# Patient Record
Sex: Female | Born: 1960 | Race: Asian | Hispanic: No | Marital: Married | Smoking: Current every day smoker
Health system: Southern US, Community
[De-identification: ages and names within clinical notes are randomized; demographics above are authoritative.]

## PROBLEM LIST (undated history)

## (undated) DIAGNOSIS — T7840XA Allergy, unspecified, initial encounter: Secondary | ICD-10-CM

## (undated) DIAGNOSIS — J45909 Unspecified asthma, uncomplicated: Secondary | ICD-10-CM

## (undated) DIAGNOSIS — E119 Type 2 diabetes mellitus without complications: Secondary | ICD-10-CM

## (undated) DIAGNOSIS — J302 Other seasonal allergic rhinitis: Secondary | ICD-10-CM

## (undated) DIAGNOSIS — M1990 Unspecified osteoarthritis, unspecified site: Secondary | ICD-10-CM

## (undated) DIAGNOSIS — K219 Gastro-esophageal reflux disease without esophagitis: Secondary | ICD-10-CM

## (undated) HISTORY — PX: CHOLECYSTECTOMY: SHX55

## (undated) HISTORY — PX: UPPER GASTROINTESTINAL ENDOSCOPY: SHX188

## (undated) HISTORY — DX: Allergy, unspecified, initial encounter: T78.40XA

## (undated) HISTORY — DX: Gastro-esophageal reflux disease without esophagitis: K21.9

## (undated) HISTORY — DX: Unspecified osteoarthritis, unspecified site: M19.90

## (undated) HISTORY — PX: APPENDECTOMY: SHX54

## (undated) HISTORY — DX: Other seasonal allergic rhinitis: J30.2

## (undated) HISTORY — PX: TOTAL ABDOMINAL HYSTERECTOMY: SHX209

## (undated) HISTORY — PX: SPINE SURGERY: SHX786

## (undated) HISTORY — DX: Type 2 diabetes mellitus without complications: E11.9

---

## 2017-02-25 ENCOUNTER — Emergency Department (HOSPITAL_COMMUNITY)
Admission: EM | Admit: 2017-02-25 | Discharge: 2017-02-25 | Disposition: A | Discharge status: Home / Self Care | Payer: Self-pay | Attending: Emergency Medicine | Admitting: Emergency Medicine

## 2017-02-25 ENCOUNTER — Encounter (HOSPITAL_COMMUNITY): Payer: Self-pay

## 2017-02-25 DIAGNOSIS — M545 Low back pain, unspecified: Secondary | ICD-10-CM

## 2017-02-25 DIAGNOSIS — J45909 Unspecified asthma, uncomplicated: Secondary | ICD-10-CM | POA: Insufficient documentation

## 2017-02-25 DIAGNOSIS — F17210 Nicotine dependence, cigarettes, uncomplicated: Secondary | ICD-10-CM | POA: Insufficient documentation

## 2017-02-25 HISTORY — DX: Unspecified asthma, uncomplicated: J45.909

## 2017-02-25 MED ORDER — DIAZEPAM 5 MG PO TABS
5.0000 mg | ORAL_TABLET | Freq: Once | ORAL | Status: AC
  Administered 2017-02-25: 5 mg via ORAL
  Filled 2017-02-25: qty 1

## 2017-02-25 MED ORDER — DIAZEPAM 5 MG PO TABS: 5.0000 mg | ORAL_TABLET | Freq: Two times a day (BID) | ORAL | 0 refills | Status: DC

## 2017-02-25 MED ORDER — OXYCODONE-ACETAMINOPHEN 5-325 MG PO TABS
1.0000 | ORAL_TABLET | Freq: Once | ORAL | Status: AC
  Administered 2017-02-25: 1 via ORAL
  Filled 2017-02-25: qty 1

## 2017-02-25 MED ORDER — DICLOFENAC SODIUM 50 MG PO TBEC: 50.0000 mg | DELAYED_RELEASE_TABLET | Freq: Two times a day (BID) | ORAL | 0 refills | Status: DC

## 2017-02-25 NOTE — ED Provider Notes (Signed)
[facility]-EMERGENCY DEPT Provider Note   CSN: [id number] Arrival date & time: 02/25/17  1627     History   Chief Complaint Chief Complaint  Patient presents with  . Back Pain    HPI Lori Watkins is a 56 y.o. female who presents to the ED with low back pain that started one week ago and has gotten worse. Patient reports that 2 years she [place] [facility]. [place] since [place] but does not have an orthopedic doctor here yet. Patient has been taking Aleve with very little relief. She states that she is required to do a lot of heavy lifting at work and works 8 or 9 hours a day.   HPI  Past Medical History:  Diagnosis Date  . Asthma     There are no active problems to display for this patient.   History reviewed. No pertinent surgical history.  OB History    No data available       Home Medications    Prior to Admission medications   Medication Sig Start Date End Date Taking? Authorizing Provider  diazepam (VALIUM) 5 MG tablet Take 1 tablet (5 mg total) by mouth 2 (two) times daily. 02/25/17   Janne NapoleonNeese, Hope M, NP  diclofenac (VOLTAREN) 50 MG EC tablet Take 1 tablet (50 mg total) by mouth 2 (two) times daily. 02/25/17   Janne NapoleonNeese, Hope M, NP    Family History No family history on file.  Social History Social History  Substance Use Topics  . Smoking status: Current Every Day Smoker    Types: Cigarettes  . Smokeless tobacco: Never Used  . Alcohol use Not on file     Allergies   Morphine and related and Sulfa antibiotics   Review of Systems Review of Systems  Constitutional: Negative for chills and fever.  HENT: Negative.   Respiratory: Negative for chest tightness and shortness of breath.   Cardiovascular: Negative for chest pain.  Gastrointestinal: Positive for nausea (with the pain). Negative for abdominal pain and vomiting.  Genitourinary: Negative for dysuria, frequency and urgency.  Musculoskeletal: Positive for back pain. Negative for  neck pain.  Skin: Negative for wound.  Neurological: Negative for syncope and headaches (hx of migraines).  Psychiatric/Behavioral: Negative for confusion. The patient is not nervous/anxious.      Physical Exam Updated Vital Signs BP (!) 151/83 (BP Location: Left Arm)   Pulse 69   Temp 98.3 F (36.8 C) (Oral)   Resp 18   SpO2 100%   Physical Exam  Constitutional: She is oriented to person, place, and time. She appears well-developed and well-nourished. No distress.  HENT:  Head: Normocephalic and atraumatic.  Right Ear: Tympanic membrane normal.  Left Ear: Tympanic membrane normal.  Nose: Nose normal.  Mouth/Throat: Uvula is midline, oropharynx is clear and moist and mucous membranes are normal.  Eyes: EOM are normal.  Neck: Normal range of motion. Neck supple.  Cardiovascular: Normal rate and regular rhythm.   Pulmonary/Chest: Effort normal and breath sounds normal.  Abdominal: Soft. Bowel sounds are normal. There is no tenderness.  Musculoskeletal:       Lumbar back: She exhibits tenderness, pain and spasm. She exhibits normal pulse.  Neurological: She is alert and oriented to person, place, and time. She has normal strength. Gait normal.  Reflex Scores:      Bicep reflexes are 2+ on the right side and 2+ on the left side.      Brachioradialis reflexes are  2+ on the right side and 2+ on the left side.      Patellar reflexes are 2+ on the right side and 2+ on the left side. Skin: Skin is warm and dry.  Psychiatric: She has a normal mood and affect. Her behavior is normal.  Nursing note and vitals reviewed.    ED Treatments / Results  Labs (all labs ordered are listed, but only abnormal results are displayed) Labs Reviewed - No data to display  Radiology No results found.  Procedures Procedures (including critical care time)  Medications Ordered in ED Medications  diazepam (VALIUM) tablet 5 mg (5 mg Oral Given 02/25/17 1856)  oxyCODONE-acetaminophen  (PERCOCET/ROXICET) 5-325 MG per tablet 1 tablet (1 tablet Oral Given 02/25/17 1856)     Initial Impression / Assessment and Plan / ED Course  I have reviewed the triage vital signs and the nursing notes.  Final Clinical Impressions(s) / ED Diagnoses  Patient with back pain.  No neurological deficits and normal neuro exam.  Patient can walk but states is painful.  No loss of bowel or bladder control.  No concern for cauda equina.  No fever, night sweats, weight loss, h/o cancer, IVDU.  RICE protocol and pain medicine indicated and discussed with patient.  Rx muscle relaxant and NSAIDS. Ortho referral. Final diagnoses:  Lumbosacral pain    New Prescriptions Discharge Medication List as of 02/25/2017  7:31 PM    START taking these medications   Details  diazepam (VALIUM) 5 MG tablet Take 1 tablet (5 mg total) by mouth 2 (two) times daily., Starting Sat 02/25/2017, Print    diclofenac (VOLTAREN) 50 MG EC tablet Take 1 tablet (50 mg total) by mouth 2 (two) times daily., Starting Sat 02/25/2017, Print         ArtoisNeese, [place] M, [place] 02/26/17 [id number]    Arby BarrettePfeiffer, Marcy, MD 02/26/17 [phone]

## 2017-02-25 NOTE — ED Triage Notes (Signed)
Patient complains of chronic back pain that is worse the past week. Denies injury and states she has ortho doctor but has no pain medicine

## 2017-02-25 NOTE — Discharge Instructions (Signed)
Call Dr. Nilsa Nuttinglin's office or call you orthopedic doctor in [place] for follow up. Do not drive while taking the medication for your back spasm as it will make you sleepy.

## 2017-05-22 ENCOUNTER — Encounter (HOSPITAL_COMMUNITY): Payer: Self-pay | Admitting: Emergency Medicine

## 2017-05-22 ENCOUNTER — Emergency Department (HOSPITAL_COMMUNITY)
Admission: EM | Admit: 2017-05-22 | Discharge: 2017-05-22 | Disposition: A | Discharge status: Home / Self Care | Payer: Managed Care, Other (non HMO) | Attending: Emergency Medicine | Admitting: Emergency Medicine

## 2017-05-22 DIAGNOSIS — J45909 Unspecified asthma, uncomplicated: Secondary | ICD-10-CM | POA: Insufficient documentation

## 2017-05-22 DIAGNOSIS — R1030 Lower abdominal pain, unspecified: Secondary | ICD-10-CM | POA: Diagnosis not present

## 2017-05-22 DIAGNOSIS — Z79899 Other long term (current) drug therapy: Secondary | ICD-10-CM | POA: Insufficient documentation

## 2017-05-22 DIAGNOSIS — K922 Gastrointestinal hemorrhage, unspecified: Secondary | ICD-10-CM | POA: Diagnosis present

## 2017-05-22 DIAGNOSIS — F17210 Nicotine dependence, cigarettes, uncomplicated: Secondary | ICD-10-CM | POA: Diagnosis not present

## 2017-05-22 DIAGNOSIS — K625 Hemorrhage of anus and rectum: Secondary | ICD-10-CM | POA: Diagnosis not present

## 2017-05-22 LAB — CBC
HEMATOCRIT: 45.3 % (ref 36.0–46.0)
HEMOGLOBIN: 14.9 g/dL (ref 12.0–15.0)
MCH: 30.9 pg (ref 26.0–34.0)
MCHC: 32.9 g/dL (ref 30.0–36.0)
MCV: 94 fL (ref 78.0–100.0)
Platelets: 321 10*3/uL (ref 150–400)
RBC: 4.82 MIL/uL (ref 3.87–5.11)
RDW: 13.2 % (ref 11.5–15.5)
WBC: 8.8 10*3/uL (ref 4.0–10.5)

## 2017-05-22 LAB — COMPREHENSIVE METABOLIC PANEL
ALBUMIN: 4.1 g/dL (ref 3.5–5.0)
ALT: 24 U/L (ref 14–54)
ANION GAP: 11 (ref 5–15)
AST: 17 U/L (ref 15–41)
Alkaline Phosphatase: 85 U/L (ref 38–126)
BILIRUBIN TOTAL: 0.7 mg/dL (ref 0.3–1.2)
BUN: 12 mg/dL (ref 6–20)
CO2: 19 mmol/L — ABNORMAL LOW (ref 22–32)
Calcium: 9.5 mg/dL (ref 8.9–10.3)
Chloride: 108 mmol/L (ref 101–111)
Creatinine, Ser: 0.75 mg/dL (ref 0.44–1.00)
GFR calc non Af Amer: >60 mL/min (ref >60)
GLUCOSE: 119 mg/dL — AB (ref 65–99)
POTASSIUM: 4.1 mmol/L (ref 3.5–5.1)
SODIUM: 138 mmol/L (ref 135–145)
TOTAL PROTEIN: 7 g/dL (ref 6.5–8.1)

## 2017-05-22 LAB — TYPE AND SCREEN
ABO/RH(D): B POS
ANTIBODY SCREEN: NEGATIVE

## 2017-05-22 LAB — ABO/RH: ABO/RH(D): B POS

## 2017-05-22 MED ORDER — OXYCODONE-ACETAMINOPHEN 5-325 MG PO TABS: 1.0000 | ORAL_TABLET | ORAL | 0 refills | Status: DC | PRN

## 2017-05-22 NOTE — ED Provider Notes (Signed)
[facility] EMERGENCY DEPARTMENT Provider Note   CSN: [id number] Arrival date & time: 05/22/17  1156     History   Chief Complaint Chief Complaint  Patient presents with  . Abdominal Pain  . GI Bleeding    HPI Lori Watkins is a 56 y.o. female.  The history is provided by the patient.  Abdominal Pain   This is a new problem. The current episode started 2 days ago. The problem occurs constantly. The problem has not changed since onset.The pain is associated with an unknown factor. The pain is located in the LLQ, RLQ and suprapubic region. The pain is moderate. Associated symptoms include diarrhea, hematochezia, nausea and vomiting. Pertinent negatives include fever, melena, dysuria and frequency. Nothing aggravates the symptoms. Nothing relieves the symptoms.   56 year old female who presents with rectal bleeding and low abdominal pain. Reports history of migraine headaches and asthma. She is not anticoagulated but does take ibuprofen routinely for headaches. She reports onset of violent nausea, vomiting and diarrhea starting 3 days ago. Since 1 day ago, she noted 3 episodes of bloody diarrhea with some clots. Denies any hematemesis. Has been having low abdominal pain associated with this. Prior history of abdominal hysterectomy. No dysuria, urinary frequency, hematuria, fevers, or upper respiratory symptoms. No recent travel or antibiotics. No sick contacts.   Past Medical History:  Diagnosis Date  . Asthma     There are no active problems to display for this patient.   History reviewed. No pertinent surgical history.  OB History    No data available       Home Medications    Prior to Admission medications   Medication Sig Start Date End Date Taking? Authorizing Provider  naproxen sodium (ALEVE) 220 MG tablet Take 440 mg by mouth daily as needed (pain).   Yes [provider]  diazepam (VALIUM) 5 MG tablet Take 1 tablet (5 mg total) by  mouth 2 (two) times daily. Patient not taking: Reported on 05/22/2017 02/25/17   Janne Napoleon, NP  diclofenac (VOLTAREN) 50 MG EC tablet Take 1 tablet (50 mg total) by mouth 2 (two) times daily. Patient not taking: Reported on 05/22/2017 02/25/17   Janne Napoleon, NP  oxyCODONE-acetaminophen (PERCOCET/ROXICET) 5-325 MG tablet Take 1 tablet by mouth every 4 (four) hours as needed for severe pain. 05/22/17   Lavera Guise, MD    Family History No family history on file.  Social History Social History  Substance Use Topics  . Smoking status: Current Every Day Smoker    Types: Cigarettes  . Smokeless tobacco: Never Used  . Alcohol use Not on file     Allergies   Morphine and related and Sulfa antibiotics   Review of Systems Review of Systems  Constitutional: Negative for fever.  Gastrointestinal: Positive for abdominal pain, diarrhea, hematochezia, nausea and vomiting. Negative for melena.  Genitourinary: Negative for dysuria and frequency.  All other systems reviewed and are negative.    Physical Exam Updated Vital Signs BP (!) 107/58 (BP Location: Left Arm)   Pulse 65   Resp 18   SpO2 98%   Physical Exam Physical Exam  Nursing note and vitals reviewed. Constitutional: Well developed, well nourished, non-toxic, and in no acute distress Head: Normocephalic and atraumatic.  Mouth/Throat: Oropharynx is clear and moist.  Neck: Normal range of motion. Neck supple.  Cardiovascular: Normal rate and regular rhythm.   Pulmonary/Chest: Effort normal and breath sounds normal.  Abdominal: Soft. There is no  tenderness. There is no rebound and no guarding. Frank blood in rectum.  Musculoskeletal: Normal range of motion.  Neurological: Alert, no facial droop, fluent speech, moves all extremities symmetrically Skin: Skin is warm and dry.  Psychiatric: Cooperative   ED Treatments / Results  Labs (all labs ordered are listed, but only abnormal results are displayed) Labs Reviewed    COMPREHENSIVE METABOLIC PANEL - Abnormal; Notable for the following:       Result Value   CO2 19 (*)    Glucose, Bld 119 (*)    All other components within normal limits  GASTROINTESTINAL PANEL BY PCR, STOOL (REPLACES STOOL CULTURE)  CBC  POC OCCULT BLOOD, ED  TYPE AND SCREEN  ABO/RH    EKG  EKG Interpretation None       Radiology No results found.  Procedures Procedures (including critical care time)  Medications Ordered in ED Medications - No data to display   Initial Impression / Assessment and Plan / ED Course  I have reviewed the triage vital signs and the nursing notes.  Pertinent labs & imaging results that were available during my care of the patient were reviewed by me and considered in my medical decision making (see chart for details).     56 year old female who presents with bloody diarrhea. She is nontoxic in no acute distress. Hemodynamically she is stable. Abdomen is not peritoneal but she does have low abdominal tenderness. Frank blood noted on rectal exam. Blood work is overall reassuring. No significant leukocytosis or anemia. Discussed obtaining CT scan to rule out potential diverticulitis or colitis or other serious processes. She initially was very agreeable to this, but during ED course states that she could not wait much longer for CT scan. She is competent and have capacity to make own decisions. Acknowledges that her work-up is incomplete and expresses understanding that we have not ruled out infection or other potential serious causes. She requests that she follow-up closely with her primary care doctor. Given GI referral as well and encouraged to return to ED for any persistent or worsening symptoms. Strict return and follow-up instructions reviewed. She expressed understanding of all discharge instructions and felt comfortable with the plan of care.   Final Clinical Impressions(s) / ED Diagnoses   Final diagnoses:  Rectal bleeding    New  Prescriptions New Prescriptions   OXYCODONE-ACETAMINOPHEN (PERCOCET/ROXICET) 5-325 MG TABLET    Take 1 tablet by mouth every 4 (four) hours as needed for severe pain.     Lavera GuiseLiu, Dana Duo, MD 05/22/17 2051

## 2017-05-22 NOTE — ED Triage Notes (Signed)
Pt to ER for evaluation of GI bleeding-  onset Saturday with lower abd pain n/v/d. States Sunday began noticing bright red blood in stools, states emesis did not have any blood noted in it. Pt reports taking 2 aleve a day for "months" for her migraines. Pt is a/o x4. NAD. Denies taking any other medications.

## 2017-05-22 NOTE — Discharge Instructions (Signed)
We have not completed your work-up today. Although your blood work is reassuring, your bleeding could get worse and put you at risk for serious medical problems that could be life threatening.   Please return without fail for worsening symptoms, including fever, escalating pain, worsening bleeding, passing out, extreme fatigue or shortness of breath with activity, or any other symptoms concerning to you.  We have also provided follow-up with gastroenterologist

## 2017-05-22 NOTE — ED Notes (Signed)
Pt wants to leave. Pt states, "I have to be at work at 6 am in the morning and I have been here since noon. I can't wait here any longer." RN explained to pt that she needs an IV prior to having a CT scan done. Rn notified MD Verdie MosherLiu of situation, MD Liu to talk with pt.

## 2017-05-25 ENCOUNTER — Other Ambulatory Visit (INDEPENDENT_AMBULATORY_CARE_PROVIDER_SITE_OTHER): Payer: Managed Care, Other (non HMO)

## 2017-05-25 ENCOUNTER — Other Ambulatory Visit: Payer: Managed Care, Other (non HMO)

## 2017-05-25 ENCOUNTER — Encounter: Payer: Self-pay | Admitting: Gastroenterology

## 2017-05-25 ENCOUNTER — Ambulatory Visit (INDEPENDENT_AMBULATORY_CARE_PROVIDER_SITE_OTHER): Payer: Managed Care, Other (non HMO) | Admitting: Nurse Practitioner

## 2017-05-25 ENCOUNTER — Encounter: Payer: Self-pay | Admitting: Nurse Practitioner

## 2017-05-25 VITALS — BP 98/72 | HR 68 | Ht 62.0 in | Wt 148.4 lb

## 2017-05-25 DIAGNOSIS — K625 Hemorrhage of anus and rectum: Secondary | ICD-10-CM

## 2017-05-25 DIAGNOSIS — R1111 Vomiting without nausea: Secondary | ICD-10-CM | POA: Diagnosis not present

## 2017-05-25 DIAGNOSIS — K920 Hematemesis: Secondary | ICD-10-CM

## 2017-05-25 DIAGNOSIS — Z791 Long term (current) use of non-steroidal anti-inflammatories (NSAID): Secondary | ICD-10-CM

## 2017-05-25 LAB — BASIC METABOLIC PANEL
BUN: 21 mg/dL (ref 6–23)
CALCIUM: 9.5 mg/dL (ref 8.4–10.5)
CO2: 27 mEq/L (ref 19–32)
CREATININE: 0.85 mg/dL (ref 0.40–1.20)
Chloride: 103 mEq/L (ref 96–112)
GFR: 73.17 mL/min (ref >60.00)
Glucose, Bld: 113 mg/dL — ABNORMAL HIGH (ref 70–99)
Potassium: 4.2 mEq/L (ref 3.5–5.1)
SODIUM: 137 meq/L (ref 135–145)

## 2017-05-25 LAB — CBC
HCT: 42.4 % (ref 36.0–46.0)
HEMOGLOBIN: 14.1 g/dL (ref 12.0–15.0)
MCHC: 33.3 g/dL (ref 30.0–36.0)
MCV: 95.3 fl (ref 78.0–100.0)
Platelets: 306 10*3/uL (ref 150.0–400.0)
RBC: 4.45 Mil/uL (ref 3.87–5.11)
RDW: 12.8 % (ref 11.5–15.5)
WBC: 7.4 10*3/uL (ref 4.0–10.5)

## 2017-05-25 NOTE — Progress Notes (Deleted)
      HPI:      Past Medical History:  Diagnosis Date  . Asthma   . Diabetes (HCC)    borderline  . Seasonal allergies      Past Surgical History:  Procedure Laterality Date  . APPENDECTOMY    . SPINE SURGERY     tumor removed  . TOTAL ABDOMINAL HYSTERECTOMY     Family History  Problem Relation Age of Onset  . Family history unknown: Yes   Social History  Substance Use Topics  . Smoking status: Current Every Day Smoker    Types: Cigarettes  . Smokeless tobacco: Never Used  . Alcohol use Yes   Current Outpatient Prescriptions  Medication Sig Dispense Refill  . esomeprazole (NEXIUM) 20 MG capsule Take 20 mg by mouth 2 (two) times daily.    . naproxen sodium (ALEVE) 220 MG tablet Take 440 mg by mouth daily as needed (pain).    Marland Kitchen. oxyCODONE-acetaminophen (PERCOCET/ROXICET) 5-325 MG tablet Take 1 tablet by mouth every 4 (four) hours as needed for severe pain. 6 tablet 0   No current facility-administered medications for this visit.    Allergies  Allergen Reactions  . Morphine And Related Anaphylaxis  . Sulfa Antibiotics Rash     Review of Systems: All systems reviewed and negative except where noted in HPI.    Physical Exam: BP 98/72   Pulse 68   Ht 5\' 2"  (1.575 m)   Wt 148 lb 6 oz (67.3 kg)   BMI 27.14 kg/m  Constitutional:  Well-developed, ***female in no acute distress. Psychiatric: Normal mood and affect. Behavior is normal. EENT: Pupils normal.  Conjunctivae are normal. No scleral icterus. Neck supple.  Cardiovascular: Normal rate, regular rhythm. No edema Pulmonary/chest: Effort normal and breath sounds normal. No wheezing, rales or rhonchi. Abdominal: Soft, nondistended. Nontender. Bowel sounds active throughout. There are no masses palpable. No hepatomegaly. Lymphadenopathy: No cervical adenopathy noted. Neurological: Alert and oriented to person place and time. Skin: Skin is warm and dry. No rashes noted.   ASSESSMENT AND PLAN:   Willette ClusterPaula  Quantae Martel, NP  05/25/2017, 8:54 AM   Bubba HalesBloomfield, Robert, MD

## 2017-05-25 NOTE — Progress Notes (Signed)
HPI: Patient is a 56 yo female here for evaluation of hematemesis and rectal bleeding. Went to ED for vomiting , generalized abdominal cramping and rectal bleeding. which started on Saturday. Emesis was dark brown, coffee. Then started having rectal bleeding. Blood from rectum started out dark then got brighter in color. Some loose stools as well. She has been taking Aleve a few times a week for a few months. Today stools were normal, no blood but yesterday she was still seeing blood in stool. Her BUN was 12 in ED. WBC normal. Hgb normal at 14.9. She is very nauseated but no further vomiting since being in ED on Sunday. No fevers. She complains of upper abdominal burning.   Patient has a hx of GERD, on chronic BID Nexium. As long as she takes the Nexium she is asymptomatic from GERD standpoint.  Never had a colonoscopy.    Past Medical History:  Diagnosis Date  . Asthma   . Diabetes (HCC)    borderline  . Seasonal allergies      Past Surgical History:  Procedure Laterality Date  . APPENDECTOMY    . SPINE SURGERY     tumor removed  . TOTAL ABDOMINAL HYSTERECTOMY     Family History  Problem Relation Age of Onset  . Family history unknown: Yes   Social History  Substance Use Topics  . Smoking status: Current Every Day Smoker    Types: Cigarettes  . Smokeless tobacco: Never Used  . Alcohol use Yes   Current Outpatient Prescriptions  Medication Sig Dispense Refill  . esomeprazole (NEXIUM) 20 MG capsule Take 20 mg by mouth 2 (two) times daily.    . naproxen sodium (ALEVE) 220 MG tablet Take 440 mg by mouth daily as needed (pain).    Marland Kitchen oxyCODONE-acetaminophen (PERCOCET/ROXICET) 5-325 MG tablet Take 1 tablet by mouth every 4 (four) hours as needed for severe pain. 6 tablet 0   No current facility-administered medications for this visit.    Allergies  Allergen Reactions  . Morphine And Related Anaphylaxis  . Sulfa Antibiotics Rash     Review of Systems: All systems  reviewed and negative except where noted in HPI.    Physical Exam: BP 98/72   Pulse 68   Ht 5\' 2"  (1.575 m)   Wt 148 lb 6 oz (67.3 kg)   BMI 27.14 kg/m  Constitutional:  Well-developed, female in no acute distress. Psychiatric: Normal mood and affect. Behavior is normal. EENT: Pupils normal.  Conjunctivae are normal. No scleral icterus. Neck supple.  Cardiovascular: Normal rate, regular rhythm. No edema Pulmonary/chest: Effort normal and breath sounds normal. No wheezing, rales or rhonchi. Abdominal: Soft, nondistended. Nontender. Bowel sounds active throughout. There are no masses palpable. No hepatomegaly. Lymphadenopathy: No cervical adenopathy noted. Neurological: Alert and oriented to person place and time. Skin: Skin is warm and dry. No rashes noted.   ASSESSMENT AND PLAN:  56 yo female seen in ED a few day ago for hematemesis and rectal bleeding in setting of NSAIDs. She was hemodynamically stable. CBC normal. The rectal bleeding is a little puzzling as it was initially dark the turned brighter red.  -She needs EGD to start with. Will get that scheduled as soon as possible. The risks and benefits of EGD were discussed and the patient agrees to proceed.  -repeat CBC today.a  -No NSAIDS -Patient has never had a colonoscopy. She will need to be scheduled for one in the near future.   Gunnar Fusi  Wilmon PaliGuenther, NP  05/25/2017, 8:57 AM

## 2017-05-25 NOTE — Patient Instructions (Signed)
If you are age 57 or older, your body mass index should be between 23-30. Your Body mass index is 27.14 kg/m. If this is out of the aforementioned range listed, please consider follow up with your Primary Care Provider.  If you are age 57 or younger, your body mass index should be between 19-25. Your Body mass index is 27.14 kg/m. If this is out of the aformentioned range listed, please consider follow up with your Primary Care Provider.   You have been scheduled for an endoscopy. Please follow written instructions given to you at your visit today. If you use inhalers (even only as needed), please bring them with you on the day of your procedure. Your physician has requested that you go to www.startemmi.com and enter the access code given to you at your visit today. This web site gives a general overview about your procedure. However, you should still follow specific instructions given to you by our office regarding your preparation for the procedure.  Your physician has requested that you go to the basement for the following lab work before leaving today: BMET CBC  Thank you for choosing me and [facility].   Willette ClusterPaula Guenther, NP

## 2017-05-26 ENCOUNTER — Ambulatory Visit (AMBULATORY_SURGERY_CENTER): Payer: Managed Care, Other (non HMO) | Admitting: Gastroenterology

## 2017-05-26 ENCOUNTER — Encounter: Payer: Self-pay | Admitting: Gastroenterology

## 2017-05-26 VITALS — BP 90/60 | HR 58 | Temp 97.3°F | Resp 17 | Ht 62.0 in | Wt 148.0 lb

## 2017-05-26 DIAGNOSIS — K920 Hematemesis: Secondary | ICD-10-CM | POA: Diagnosis not present

## 2017-05-26 DIAGNOSIS — R1110 Vomiting, unspecified: Secondary | ICD-10-CM

## 2017-05-26 MED ORDER — SODIUM CHLORIDE 0.9 % IV SOLN: 500.0000 mL | INTRAVENOUS | Status: DC

## 2017-05-26 NOTE — Progress Notes (Signed)
Called to room to assist during endoscopic procedure.  Patient ID and intended procedure confirmed with present staff. Received instructions for my participation in the procedure from the performing physician.  

## 2017-05-26 NOTE — Progress Notes (Signed)
Report to PACU, RN, vss, BBS= Clear.  

## 2017-05-26 NOTE — Patient Instructions (Addendum)
YOU HAD AN ENDOSCOPIC PROCEDURE TODAY AT THE [facility]:   Refer to the procedure report that was given to you for any specific questions about what was found during the examination.  If the procedure report does not answer your questions, please call your gastroenterologist to clarify.  If you requested that your care partner not be given the details of your procedure findings, then the procedure report has been included in a sealed envelope for you to review at your convenience later.  YOU SHOULD EXPECT: Some feelings of bloating in the abdomen. Passage of more gas than usual.  Walking can help get rid of the air that was put into your GI tract during the procedure and reduce the bloating. If you had a lower endoscopy (such as a colonoscopy or flexible sigmoidoscopy) you may notice spotting of blood in your stool or on the toilet paper. If you underwent a bowel prep for your procedure, you may not have a normal bowel movement for a few days.  Please Note:  You might notice some irritation and congestion in your nose or some drainage.  This is from the oxygen used during your procedure.  There is no need for concern and it should clear up in a day or so.  SYMPTOMS TO REPORT IMMEDIATELY:   Following lower endoscopy (colonoscopy or flexible sigmoidoscopy):  Excessive amounts of blood in the stool  Significant tenderness or worsening of abdominal pains  Swelling of the abdomen that is new, acute  Fever of 100F or higher   Following upper endoscopy (EGD)  Vomiting of blood or coffee ground material  New chest pain or pain under the shoulder blades  Painful or persistently difficult swallowing  New shortness of breath  Fever of 100F or higher  Black, tarry-looking stools  For urgent or emergent issues, a gastroenterologist can be reached at any hour by calling [phone].   DIET:  We do recommend a small meal at first, but then you may proceed to your regular diet.  Drink  plenty of fluids but you should avoid alcoholic beverages for 24 hours.  ACTIVITY:  You should plan to take it easy for the rest of today and you should NOT DRIVE or use heavy machinery until tomorrow (because of the sedation medicines used during the test).    FOLLOW UP: Our staff will call the number listed on your records the next business day following your procedure to check on you and address any questions or concerns that you may have regarding the information given to you following your procedure. If we do not reach you, we will leave a message.  However, if you are feeling well and you are not experiencing any problems, there is no need to return our call.  We will assume that you have returned to your regular daily activities without incident.  If any biopsies were taken you will be contacted by phone or by letter within the next 1-3 weeks.  Please call us at [phone] if you have not heard about the biopsies in 3 weeks.    SIGNATURES/CONFIDENTIALITY: You and/or your care partner have signed paperwork which will be entered into your electronic medical record.  These signatures attest to the fact that that the information above on your After Visit Summary has been reviewed and is understood.  Full responsibility of the confidentiality of this discharge information lies with you and/or your care-partner.  Hiatal hernia information given. 

## 2017-05-26 NOTE — Op Note (Signed)
[facility] Patient Name: Lori DialsRadicka Armentor Procedure Date: 05/26/2017 3:50 PM MRN: [id number] Endoscopist: Napoleon FormKavitha V. Nandigam , MD Age: 56 Referring MD:  Date of Birth: [birth year] Gender: Female Account #: [id number] Procedure:                Upper GI endoscopy Indications:              Persistent vomiting of unknown cause Medicines:                Monitored Anesthesia Care Procedure:                Pre-Anesthesia Assessment:                           - Prior to the procedure, a History and Physical                            was performed, and patient medications and                            allergies were reviewed. The patient's tolerance of                            previous anesthesia was also reviewed. The risks                            and benefits of the procedure and the sedation                            options and risks were discussed with the patient.                            All questions were answered, and informed consent                            was obtained. Prior Anticoagulants: The patient has                            taken no previous anticoagulant or antiplatelet                            agents. ASA Grade Assessment: II - A patient with                            mild systemic disease. After reviewing the risks                            and benefits, the patient was deemed in                            satisfactory condition to undergo the procedure.                           After obtaining informed consent, the endoscope was  passed under direct vision. Throughout the                            procedure, the patient's blood pressure, pulse, and                            oxygen saturations were monitored continuously. The                            Endoscope was introduced through the mouth, and                            advanced to the second part of duodenum. The upper                            GI  endoscopy was accomplished without difficulty.                            The patient tolerated the procedure well. Scope In: Scope Out: Findings:                 The esophagus was normal.                           A small hiatal hernia was present.                           A few localized, less than 5 mm non-bleeding                            erosions were found in the gastric antrum. There                            were no stigmata of recent bleeding. Biopsies were                            taken with a cold forceps for Helicobacter pylori                            testing.                           The examined duodenum was normal. Complications:            No immediate complications. Estimated Blood Loss:     Estimated blood loss was minimal. Impression:               - Normal esophagus.                           - Small hiatal hernia.                           - Non-bleeding erosive gastropathy. Biopsied.                           - Normal examined  duodenum. Recommendation:           - Patient has a contact number available for                            emergencies. The signs and symptoms of potential                            delayed complications were discussed with the                            patient. Return to normal activities tomorrow.                            Written discharge instructions were provided to the                            patient.                           - Resume previous diet.                           - Continue present medications.                           - Await pathology results. Napoleon Form, MD 05/26/2017 4:09:47 PM This report has been signed electronically.

## 2017-05-28 ENCOUNTER — Encounter: Payer: Self-pay | Admitting: Nurse Practitioner

## 2017-05-29 ENCOUNTER — Telehealth: Payer: Self-pay

## 2017-05-29 NOTE — Telephone Encounter (Signed)
  Follow up Call-  Call Lori Watkins number 05/26/2017  Post procedure Call Lori Watkins phone  # [phone]  Permission to leave phone message Yes     Patient questions:  Do you have a fever, pain , or abdominal swelling? No. Pain Score  0 *  Have you tolerated food without any problems? Yes.    Have you been able to return to your normal activities? Yes.    Do you have any questions about your discharge instructions: Diet   No. Medications  No. Follow up visit  No.  Do you have questions or concerns about your Care? No.  Actions: * If pain score is 4 or above: No action needed, pain <4.

## 2017-06-02 ENCOUNTER — Encounter: Payer: Self-pay | Admitting: Gastroenterology

## 2017-06-02 NOTE — Progress Notes (Signed)
Reviewed and agree with documentation and assessment and plan. K. Veena Mong Neal , MD   

## 2017-08-28 ENCOUNTER — Other Ambulatory Visit: Payer: Self-pay

## 2017-08-28 ENCOUNTER — Encounter (HOSPITAL_COMMUNITY): Payer: Self-pay | Admitting: *Deleted

## 2017-08-28 ENCOUNTER — Emergency Department (HOSPITAL_COMMUNITY)
Admission: EM | Admit: 2017-08-28 | Discharge: 2017-08-28 | Disposition: A | Discharge status: Home / Self Care | Payer: Managed Care, Other (non HMO) | Attending: Emergency Medicine | Admitting: Emergency Medicine

## 2017-08-28 DIAGNOSIS — F17210 Nicotine dependence, cigarettes, uncomplicated: Secondary | ICD-10-CM | POA: Insufficient documentation

## 2017-08-28 DIAGNOSIS — J302 Other seasonal allergic rhinitis: Secondary | ICD-10-CM | POA: Insufficient documentation

## 2017-08-28 DIAGNOSIS — J45909 Unspecified asthma, uncomplicated: Secondary | ICD-10-CM | POA: Insufficient documentation

## 2017-08-28 DIAGNOSIS — Z79899 Other long term (current) drug therapy: Secondary | ICD-10-CM | POA: Insufficient documentation

## 2017-08-28 DIAGNOSIS — H00011 Hordeolum externum right upper eyelid: Secondary | ICD-10-CM | POA: Insufficient documentation

## 2017-08-28 DIAGNOSIS — H579 Unspecified disorder of eye and adnexa: Secondary | ICD-10-CM | POA: Diagnosis present

## 2017-08-28 MED ORDER — ERYTHROMYCIN 5 MG/GM OP OINT: TOPICAL_OINTMENT | OPHTHALMIC | 0 refills | Status: DC

## 2017-08-28 MED ORDER — FLUORESCEIN SODIUM 1 MG OP STRP: 1.0000 | ORAL_STRIP | Freq: Once | OPHTHALMIC | Status: DC

## 2017-08-28 MED ORDER — TETRACAINE HCL 0.5 % OP SOLN: 2.0000 [drp] | Freq: Once | OPHTHALMIC | Status: DC

## 2017-08-28 NOTE — ED Provider Notes (Signed)
[facility] Provider Note   CSN: [id number] Arrival date & time: 08/28/17  1319     History   Chief Complaint Chief Complaint  Patient presents with  . Eye Problem    HPI Lori Watkins is a 57 y.o. female.  HPI   Ms. Lori Watkins is a 57yo female with a history of asthma, allergies, pre-diabetes and GERD who presents to the emergency department for evaluation of right eyelid bump. Patient states that she noticed the red bump on the eyelid about 4 days ago.  It is tender to the touch.  Was concerned because she recently was exposed to someone who was diagnosed with pinkeye.  States that she also has had yellow drainage from the eyelid which appears crusty.  She endorses sensitivity to light. Has not tried any over-the-counter medications for her symptoms.  Denies visual disturbance, headache, painful EOM, itchy eye.  She does not use corrective lenses.  Past Medical History:  Diagnosis Date  . Allergy   . Arthritis   . Asthma   . Diabetes (HCC)    borderline  . GERD (gastroesophageal reflux disease)   . Seasonal allergies     There are no active problems to display for this patient.   Past Surgical History:  Procedure Laterality Date  . APPENDECTOMY    . CESAREAN SECTION    . CHOLECYSTECTOMY    . SPINE SURGERY     tumor removed  . TOTAL ABDOMINAL HYSTERECTOMY      OB History    No data available       Home Medications    Prior to Admission medications   Medication Sig Start Date End Date Taking? Authorizing Provider  esomeprazole (NEXIUM) 20 MG capsule Take 20 mg by mouth 2 (two) times daily.    [provider]  naproxen sodium (ALEVE) 220 MG tablet Take 440 mg by mouth daily as needed (pain).    [provider]  oxyCODONE-acetaminophen (PERCOCET/ROXICET) 5-325 MG tablet Take 1 tablet by mouth every 4 (four) hours as needed for severe pain. 05/22/17   Lavera GuiseLiu, Dana Duo, MD    Family History Family History   Family history unknown: Yes    Social History Social History   Tobacco Use  . Smoking status: Current Every Day Smoker    Packs/day: 0.25    Types: Cigarettes  . Smokeless tobacco: Never Used  Substance Use Topics  . Alcohol use: Yes    Comment: RARE  . Drug use: No     Allergies   Morphine and related and Sulfa antibiotics   Review of Systems Review of Systems  Eyes: Positive for photophobia, discharge and redness (eyelid redness). Negative for itching and visual disturbance.  Neurological: Negative for headaches.     Physical Exam Updated Vital Signs BP (!) 146/83 (BP Location: Left Arm)   Pulse 90   Temp 98.5 F (36.9 C) (Oral)   Resp 18   SpO2 100%   Physical Exam  Constitutional: She appears well-developed and well-nourished. No distress.  HENT:  Head: Normocephalic and atraumatic.  Eyes: Right eye exhibits no discharge. Left eye exhibits no discharge.    Approximately 0.5cm erythematous nodule noted on the upper right eyelid. It is tender to the touch. No drainage noted in the eye. No conjunctival erythema. EOM intact. PERRL.   Pulmonary/Chest: Effort normal. No respiratory distress.  Neurological: She is alert. Coordination normal.  Skin: She is not diaphoretic.  Psychiatric: She has a normal mood  and affect. Her behavior is normal.  Nursing note and vitals reviewed.   ED Treatments / Results  Labs (all labs ordered are listed, but only abnormal results are displayed) Labs Reviewed - No data to display  EKG  EKG Interpretation None       Radiology No results found.  Procedures Procedures (including critical care time)   Initial Impression / Assessment and Plan / ED Course  I have reviewed the triage vital signs and the nursing notes.  Pertinent labs & imaging results that were available during my care of the patient were reviewed by me and considered in my medical decision making (see chart for details).     Patient with  hordeolum. Have counseled patient on use of warm compresses for symptomatic relief. Presentation not concerning for conjunctivitis, iritis, corneal abrasion, orbital cellulitis. Discussed return precautions with patient and she agrees.    Final Clinical Impressions(s) / ED Diagnoses   Final diagnoses:  Hordeolum externum of right upper eyelid    ED Discharge Orders    None       Lawrence Marseilles 08/28/17 1953    Raeford Razor, MD 08/29/17 [phone]

## 2017-08-28 NOTE — ED Triage Notes (Signed)
Pt reports exposure to pink eye and now has redness to right eye, drainage and sensitivity to light. Also has right ear pain. No acute distress is noted at triage.

## 2017-08-28 NOTE — Discharge Instructions (Signed)
You have a stye on the right upper lid.  Please use warm compresses at least twice a day to help with inflammation and drainage.  You can also use antibiotic ointment over the right eye.   Follow up with your regular doctor in a week if your symptoms are not improving.   Return to the ER if you have fever >100.15F, loss of vision or have any other new or concerning symptoms.

## 2017-09-02 ENCOUNTER — Emergency Department (HOSPITAL_COMMUNITY)
Admission: EM | Admit: 2017-09-02 | Discharge: 2017-09-02 | Disposition: A | Discharge status: Home / Self Care | Payer: Managed Care, Other (non HMO) | Attending: Emergency Medicine | Admitting: Emergency Medicine

## 2017-09-02 ENCOUNTER — Encounter (HOSPITAL_COMMUNITY): Payer: Self-pay

## 2017-09-02 ENCOUNTER — Emergency Department (HOSPITAL_COMMUNITY): Payer: Managed Care, Other (non HMO)

## 2017-09-02 DIAGNOSIS — F17210 Nicotine dependence, cigarettes, uncomplicated: Secondary | ICD-10-CM | POA: Insufficient documentation

## 2017-09-02 DIAGNOSIS — J45909 Unspecified asthma, uncomplicated: Secondary | ICD-10-CM | POA: Diagnosis not present

## 2017-09-02 DIAGNOSIS — H00011 Hordeolum externum right upper eyelid: Secondary | ICD-10-CM | POA: Insufficient documentation

## 2017-09-02 DIAGNOSIS — Z79899 Other long term (current) drug therapy: Secondary | ICD-10-CM | POA: Insufficient documentation

## 2017-09-02 DIAGNOSIS — H05221 Edema of right orbit: Secondary | ICD-10-CM | POA: Diagnosis present

## 2017-09-02 LAB — CBC
HCT: 43.2 % (ref 36.0–46.0)
Hemoglobin: 14.4 g/dL (ref 12.0–15.0)
MCH: 31.6 pg (ref 26.0–34.0)
MCHC: 33.3 g/dL (ref 30.0–36.0)
MCV: 94.7 fL (ref 78.0–100.0)
PLATELETS: 303 10*3/uL (ref 150–400)
RBC: 4.56 MIL/uL (ref 3.87–5.11)
RDW: 13.3 % (ref 11.5–15.5)
WBC: 8.8 10*3/uL (ref 4.0–10.5)

## 2017-09-02 LAB — I-STAT CHEM 8, ED
BUN: 18 mg/dL (ref 6–20)
CALCIUM ION: 1.18 mmol/L (ref 1.15–1.40)
CHLORIDE: 107 mmol/L (ref 101–111)
Creatinine, Ser: 0.7 mg/dL (ref 0.44–1.00)
Glucose, Bld: 85 mg/dL (ref 65–99)
HCT: 45 % (ref 36.0–46.0)
Hemoglobin: 15.3 g/dL — ABNORMAL HIGH (ref 12.0–15.0)
POTASSIUM: 3.7 mmol/L (ref 3.5–5.1)
SODIUM: 142 mmol/L (ref 135–145)
TCO2: 26 mmol/L (ref 22–32)

## 2017-09-02 LAB — BASIC METABOLIC PANEL
ANION GAP: 12 (ref 5–15)
BUN: 17 mg/dL (ref 6–20)
CO2: 21 mmol/L — AB (ref 22–32)
Calcium: 9.3 mg/dL (ref 8.9–10.3)
Chloride: 108 mmol/L (ref 101–111)
Creatinine, Ser: 0.81 mg/dL (ref 0.44–1.00)
GFR calc Af Amer: >60 mL/min (ref >60)
Glucose, Bld: 89 mg/dL (ref 65–99)
POTASSIUM: 3.9 mmol/L (ref 3.5–5.1)
Sodium: 141 mmol/L (ref 135–145)

## 2017-09-02 MED ORDER — TRAMADOL HCL 50 MG PO TABS: 50.0000 mg | ORAL_TABLET | Freq: Two times a day (BID) | ORAL | 0 refills | Status: DC | PRN

## 2017-09-02 MED ORDER — KETOROLAC TROMETHAMINE 15 MG/ML IJ SOLN
15.0000 mg | Freq: Once | INTRAMUSCULAR | Status: AC
  Administered 2017-09-02: 15 mg via INTRAVENOUS
  Filled 2017-09-02: qty 1

## 2017-09-02 MED ORDER — DOXYCYCLINE HYCLATE 100 MG PO CAPS: 100.0000 mg | ORAL_CAPSULE | Freq: Two times a day (BID) | ORAL | 0 refills | Status: DC

## 2017-09-02 MED ORDER — IOPAMIDOL (ISOVUE-300) INJECTION 61%
INTRAVENOUS | Status: AC
  Administered 2017-09-02: 75 mL
  Filled 2017-09-02: qty 75

## 2017-09-02 NOTE — ED Provider Notes (Signed)
[facility] EMERGENCY DEPARTMENT Provider Note   CSN: [id number] Arrival date & time: 09/02/17  1435     History   Chief Complaint No chief complaint on file.   HPI Lori Watkins is a 57 y.o. female presenting for evaluation of right eyelid pain and swelling.  Pt states that she started to have pain and swelling 9 days ago.  She was evaluated in the ER 5 days ago, diagnosed with a stye.  She has been using erythromycin ointment and warm compresses without improvement of symptoms.  She uses Tylenol or ibuprofen as needed for pain, but this is not helping.  She reports worsening swelling surrounding the eye, worsening pain.  Pain is radiating to the ear and jaw.  She reports she is unable to to eat due to pain.  She describes vision blurriness, but denies vision change.  She does not wear contacts or glasses.  She denies fall, trauma, or injury.  She denies history of similar.  She reports subjective fevers.  She has no other medical problems, does not take medications daily.  She reports that initially she had yellow drainage when she woke up, but this has improved.  Patient denies sore throat, cough, chest pain, nausea, vomiting, abdominal pain, urinary symptoms, abnormal bowel movements.  HPI  Past Medical History:  Diagnosis Date  . Allergy   . Arthritis   . Asthma   . Diabetes (HCC)    borderline  . GERD (gastroesophageal reflux disease)   . Seasonal allergies     There are no active problems to display for this patient.   Past Surgical History:  Procedure Laterality Date  . APPENDECTOMY    . CESAREAN SECTION    . CHOLECYSTECTOMY    . SPINE SURGERY     tumor removed  . TOTAL ABDOMINAL HYSTERECTOMY      OB History    No data available       Home Medications    Prior to Admission medications   Medication Sig Start Date End Date Taking? Authorizing Provider  doxycycline (VIBRAMYCIN) 100 MG capsule Take 1 capsule (100 mg total) by mouth 2  (two) times daily. 09/02/17   Samanyu Tinnell, PA-C  erythromycin ophthalmic ointment Place a 1/2 inch ribbon of ointment into the lower eyelid. 08/28/17   Kellie Shropshire, PA-C  esomeprazole (NEXIUM) 20 MG capsule Take 20 mg by mouth 2 (two) times daily.    [provider]  naproxen sodium (ALEVE) 220 MG tablet Take 440 mg by mouth daily as needed (pain).    [provider]  oxyCODONE-acetaminophen (PERCOCET/ROXICET) 5-325 MG tablet Take 1 tablet by mouth every 4 (four) hours as needed for severe pain. 05/22/17   Lavera Guise, MD  traMADol (ULTRAM) 50 MG tablet Take 1 tablet (50 mg total) by mouth every 12 (twelve) hours as needed for severe pain. 09/02/17   Heyden Jaber, PA-C    Family History Family History  Family history unknown: Yes    Social History Social History   Tobacco Use  . Smoking status: Current Every Day Smoker    Packs/day: 0.25    Types: Cigarettes  . Smokeless tobacco: Never Used  Substance Use Topics  . Alcohol use: Yes    Comment: RARE  . Drug use: No     Allergies   Morphine and related; Vicodin [hydrocodone-acetaminophen]; and Sulfa antibiotics   Review of Systems Review of Systems  Constitutional: Positive for fever (subjective).  HENT: Positive for ear  pain and facial swelling. Negative for sore throat.   Eyes: Positive for pain. Negative for photophobia and discharge.  Respiratory: Negative for cough and shortness of breath.   Cardiovascular: Negative for chest pain.  Gastrointestinal: Negative for abdominal pain, nausea and vomiting.  Genitourinary: Negative for dysuria, frequency and hematuria.  Musculoskeletal: Negative for neck pain.  Skin: Negative for rash.  Allergic/Immunologic: Negative for immunocompromised state.  Neurological: Negative for dizziness and headaches.  Hematological: Does not bruise/bleed easily.  Psychiatric/Behavioral: Negative for confusion.     Physical Exam Updated Vital Signs BP 135/81  (BP Location: Right Arm)   Pulse 70   Temp 98 F (36.7 C) (Oral)   Resp 16   SpO2 98%   Physical Exam  Constitutional: She is oriented to person, place, and time. She appears well-developed and well-nourished. No distress.  HENT:  Head: Normocephalic and atraumatic.  Right Ear: Tympanic membrane, external ear and ear canal normal.  Left Ear: Tympanic membrane, external ear and ear canal normal.  Nose: Nose normal.  Mouth/Throat: Uvula is midline, oropharynx is clear and moist and mucous membranes are normal.  Tenderness palpation of the right side of the face.  Uvula midline with equal palate rise.  OP clear without tonsillar swelling or exudate.  TMs nonerythematous and not bulging bilaterally.  Right-sided postauricular and submandibular lymphadenopathy.  Eyes: Conjunctivae and EOM are normal. Pupils are equal, round, and reactive to light.    Lesion noted in the right eyelid.  Mild periorbital swelling.  No obvious facial swelling.  EOMI without pain.  PERRLA.  No injection of the conjunctive sclera.  No signs of iritis.  No photophobia.  Neck: Normal range of motion.  Cardiovascular: Normal rate, regular rhythm and intact distal pulses.  Pulmonary/Chest: Effort normal and breath sounds normal. No respiratory distress. She has no wheezes.  Abdominal: She exhibits no distension.  Musculoskeletal: Normal range of motion.  Neurological: She is alert and oriented to person, place, and time.  Skin: Skin is warm. No rash noted.  Psychiatric: She has a normal mood and affect.  Nursing note and vitals reviewed.    ED Treatments / Results  Labs (all labs ordered are listed, but only abnormal results are displayed) Labs Reviewed  BASIC METABOLIC PANEL - Abnormal; Notable for the following components:      Result Value   CO2 21 (*)    All other components within normal limits  I-STAT CHEM 8, ED - Abnormal; Notable for the following components:   Hemoglobin 15.3 (*)    All other  components within normal limits  CBC    EKG  EKG Interpretation None       Radiology Ct Orbits W Contrast  Result Date: 09/02/2017 CLINICAL DATA:  Pain at the right eyelid. Stye 5 days ago. Progressive inflammation despite treatment. Inflammation of the eyelid. EXAM: CT ORBITS WITH CONTRAST TECHNIQUE: Multidetector CT images was performed according to the standard protocol following intravenous contrast administration. CONTRAST:  75mL ISOVUE-300 IOPAMIDOL (ISOVUE-300) INJECTION 61% COMPARISON:  None. FINDINGS: Orbits: A low-density peripherally enhancing 8 mm lesion within the right eyelid is compatible with persistent obstruction of well ducts within the eyelid. There is no other significant inflammatory change. No postseptal inflammatory changes are present. The lacrimal gland is separate. The globe is within normal limits. Rectus musculature is unremarkable. The optic nerve is normal bilaterally. Cavernous sinus is normal. Visualized sinuses: A fluid level is present in the left sphenoid sinus. 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 The remaining paranasal sinuses are clear. Soft tissues: The soft tissues of the face are otherwise unremarkable. Limited intracranial: Limited imaging the brain is within normal limits. IMPRESSION: 1. Hordeolum of the right eyelid without other significant inflammatory changes. No postseptal inflammatory change. 2. Otherwise unremarkable orbit CT. Electronically Signed   By: Marin Roberts M.D.   On: 09/02/2017 19:22    Procedures Procedures (including critical care time)  Medications Ordered in ED Medications  iopamidol (ISOVUE-300) 61 % injection (75 mLs  Contrast Given 09/02/17 1839)  ketorolac (TORADOL) 15 MG/ML injection 15 mg (15 mg Intravenous Given 09/02/17 1918)     Initial Impression / Assessment and Plan / ED Course  I have reviewed the triage vital signs and the nursing notes.  Pertinent labs & imaging  results that were available during my care of the patient were reviewed by me and considered in my medical decision making (see chart for details).     Patient presenting for evaluation of eyelid pain and swelling.  Physical exam shows tender, erythematous lesion of the right upper eyelid with mild periorbital swelling.  EOMI and PERRLA.  As this is been going on for 9 days, and worsening with erythromycin and warm compresses, will obtain CT scan to rule out abscess or mass of eyelid.  Doubt post septal cellulitis.  Will obtain basic blood work.    Labs reassuring. CT shows hordeolum without signs of post septal cellulitis. Discussed findings with pt. Will place on doxy and have pt f/u with ophthalmology for further management. Tylenol and tramadol for pain (pt states she cannot take ibuprofen). Checked PMP, pt with minimal narcotic use. At this time, pt appears safe for discharge. Return precautions given. Pt states she understands and agrees to plan.   Final Clinical Impressions(s) / ED Diagnoses   Final diagnoses:  Hordeolum of right upper eyelid, unspecified hordeolum type    ED Discharge Orders        Ordered    doxycycline (VIBRAMYCIN) 100 MG capsule  2 times daily     09/02/17 1936    traMADol (ULTRAM) 50 MG tablet  Every 12 hours PRN     09/02/17 1944       Alveria Apley, PA-C 09/02/17 1951    Tilden Fossa, MD 09/03/17 1453

## 2017-09-02 NOTE — Discharge Instructions (Signed)
Take antibiotics as prescribed.  Take the entire course, even if your symptoms improve. Take 1000 mg of Tylenol 3 times a day.  Use Ultram as needed for severe pain.  Do not drive while taking this medicine. Continue to use warm compresses. Follow-up with the ophthalmologist on Monday for further evaluation and management of your eye. Return to the emergency room if you develop vision loss, inability to move your eye, or any new or worsening symptoms.

## 2017-09-02 NOTE — ED Triage Notes (Addendum)
Pt presents with continued pain to R eyelid, diagnosed with sty x 5-6 days ago,  Pt reports she is using the medication but swelling is worse, pain to R ear is worse and now she is having R sided jaw pain. Pt reports swelling to R side of face with redness.

## 2017-09-02 NOTE — ED Notes (Signed)
Pt understood dc material. NAD noted. Script given at dc  

## 2017-11-24 ENCOUNTER — Emergency Department (HOSPITAL_COMMUNITY): Payer: Managed Care, Other (non HMO)

## 2017-11-24 ENCOUNTER — Emergency Department (HOSPITAL_COMMUNITY)
Admission: EM | Admit: 2017-11-24 | Discharge: 2017-11-24 | Disposition: A | Discharge status: Home / Self Care | Payer: Managed Care, Other (non HMO) | Attending: Emergency Medicine | Admitting: Emergency Medicine

## 2017-11-24 ENCOUNTER — Encounter (HOSPITAL_COMMUNITY): Payer: Self-pay | Admitting: Emergency Medicine

## 2017-11-24 DIAGNOSIS — J45909 Unspecified asthma, uncomplicated: Secondary | ICD-10-CM | POA: Insufficient documentation

## 2017-11-24 DIAGNOSIS — E119 Type 2 diabetes mellitus without complications: Secondary | ICD-10-CM | POA: Insufficient documentation

## 2017-11-24 DIAGNOSIS — K1121 Acute sialoadenitis: Secondary | ICD-10-CM | POA: Diagnosis not present

## 2017-11-24 DIAGNOSIS — R590 Localized enlarged lymph nodes: Secondary | ICD-10-CM | POA: Insufficient documentation

## 2017-11-24 DIAGNOSIS — F17210 Nicotine dependence, cigarettes, uncomplicated: Secondary | ICD-10-CM | POA: Insufficient documentation

## 2017-11-24 DIAGNOSIS — R221 Localized swelling, mass and lump, neck: Secondary | ICD-10-CM | POA: Diagnosis present

## 2017-11-24 LAB — I-STAT CHEM 8, ED
BUN: 14 mg/dL (ref 6–20)
CALCIUM ION: 1.17 mmol/L (ref 1.15–1.40)
CHLORIDE: 106 mmol/L (ref 101–111)
CREATININE: 0.8 mg/dL (ref 0.44–1.00)
GLUCOSE: 95 mg/dL (ref 65–99)
HEMATOCRIT: 43 % (ref 36.0–46.0)
Hemoglobin: 14.6 g/dL (ref 12.0–15.0)
Potassium: 3.7 mmol/L (ref 3.5–5.1)
SODIUM: 139 mmol/L (ref 135–145)
TCO2: 21 mmol/L — AB (ref 22–32)

## 2017-11-24 LAB — CBC WITH DIFFERENTIAL/PLATELET
Basophils Absolute: 0 10*3/uL (ref 0.0–0.1)
Basophils Relative: 0 %
EOS ABS: 0.1 10*3/uL (ref 0.0–0.7)
Eosinophils Relative: 2 %
HEMATOCRIT: 42.7 % (ref 36.0–46.0)
HEMOGLOBIN: 14.3 g/dL (ref 12.0–15.0)
LYMPHS ABS: 3.1 10*3/uL (ref 0.7–4.0)
Lymphocytes Relative: 34 %
MCH: 31.2 pg (ref 26.0–34.0)
MCHC: 33.5 g/dL (ref 30.0–36.0)
MCV: 93 fL (ref 78.0–100.0)
MONOS PCT: 6 %
Monocytes Absolute: 0.6 10*3/uL (ref 0.1–1.0)
NEUTROS ABS: 5.3 10*3/uL (ref 1.7–7.7)
NEUTROS PCT: 58 %
Platelets: 325 10*3/uL (ref 150–400)
RBC: 4.59 MIL/uL (ref 3.87–5.11)
RDW: 13.2 % (ref 11.5–15.5)
WBC: 9.1 10*3/uL (ref 4.0–10.5)

## 2017-11-24 LAB — GROUP A STREP BY PCR: GROUP A STREP BY PCR: NOT DETECTED

## 2017-11-24 MED ORDER — MELOXICAM 15 MG PO TABS: 15.0000 mg | ORAL_TABLET | Freq: Every day | ORAL | 0 refills | Status: DC

## 2017-11-24 MED ORDER — IOHEXOL 300 MG/ML  SOLN
75.0000 mL | Freq: Once | INTRAMUSCULAR | Status: AC | PRN
  Administered 2017-11-24: 75 mL via INTRAVENOUS

## 2017-11-24 MED ORDER — AMOXICILLIN-POT CLAVULANATE 875-125 MG PO TABS: 1.0000 | ORAL_TABLET | Freq: Two times a day (BID) | ORAL | 0 refills | Status: DC

## 2017-11-24 NOTE — ED Provider Notes (Signed)
5:09 PM BP 125/73 (BP Location: Right Arm)   Pulse 72   Temp 98 F (36.7 C) (Oral)   Resp 18   SpO2 100%  Patient taken in sign out form PA Laveda Norman. Patient here with swollen lymph nodes x 1 month. Awaiting Ct neck.   7:25 PM BP 125/73 (BP Location: Right Arm)   Pulse 72   Temp 98 F (36.7 C) (Oral)   Resp 18   SpO2 100%   I have discussed the case with Dr. Juleen China.  CT scan shows parotitis.  Given the fact that she is afebrile, it has been going on for 1 month and she has no voice changes or airway occlusion we will treat with oral antibiotics.  Patient is advised to make an appointment with ENT for follow-up.  She may cancel if her symptoms are improving.  I discussed return precautions with the patient.  She appears appropriate for discharge at this time   Arthor Captain, Cordelia Poche 11/24/17 1945    Raeford Razor, MD 11/28/17 1043

## 2017-11-24 NOTE — Discharge Instructions (Signed)
Contact a health care provider if: You have a fever or chills. You have new symptoms. Your symptoms get worse. Your symptoms do not improve with treatment.

## 2017-11-24 NOTE — ED Notes (Signed)
Returned from ct 

## 2017-11-24 NOTE — ED Notes (Signed)
To ct

## 2017-11-24 NOTE — ED Triage Notes (Signed)
Pt states 1.5 months ago she noticed she had swelling to left neck. Took Meloxicam for 30 days, states the lymph nodes to her neck are still swollen and she is out of the mediation. Pain with yawning, coughing, eating to left ear and left neck.

## 2017-11-24 NOTE — ED Provider Notes (Signed)
[facility] EMERGENCY DEPARTMENT Provider Note   CSN: [id number] Arrival date & time: 11/24/17  1559     History   Chief Complaint Chief Complaint  Patient presents with  . Swollen lymph nodes    neck    HPI Lori Watkins is a 57 y.o. female.  HPI   57 year old female with history of diabetes, GERD, allergy, presenting with a swollen lymph node in the neck.  Patient mention for about a month and a half she has had swollen lymph node to her neck.  It started to the left side of her neck and now has bright to both side of the neck.  Pain is sharp throbbing achy constant worsening with talking movement swallowing.  She is having trouble eating due to the pain and states she lost approximately 6 pounds within the past week due to decreased eating.  She also endorsed night sweats.  She is a smoker and smokes approximately 5 cigarettes a day.  No prior history of cancer.  No hearing changes or voice changes.  She was initially seen by her PCP for this.  At the onset of her symptoms.  She was given meloxicam for a month.  She took the medication which did provide relief but now that she is without her medication, her pain has becoming more uncomfortable.  Past Medical History:  Diagnosis Date  . Allergy   . Arthritis   . Asthma   . Diabetes (HCC)    borderline  . GERD (gastroesophageal reflux disease)   . Seasonal allergies     There are no active problems to display for this patient.   Past Surgical History:  Procedure Laterality Date  . APPENDECTOMY    . CESAREAN SECTION    . CHOLECYSTECTOMY    . SPINE SURGERY     tumor removed  . TOTAL ABDOMINAL HYSTERECTOMY       OB History   None      Home Medications    Prior to Admission medications   Medication Sig Start Date End Date Taking? Authorizing Provider  doxycycline (VIBRAMYCIN) 100 MG capsule Take 1 capsule (100 mg total) by mouth 2 (two) times daily. 09/02/17   Caccavale, Sophia, PA-C    erythromycin ophthalmic ointment Place a 1/2 inch ribbon of ointment into the lower eyelid. 08/28/17   Kellie Shropshire, PA-C  esomeprazole (NEXIUM) 20 MG capsule Take 20 mg by mouth 2 (two) times daily.    [provider]  naproxen sodium (ALEVE) 220 MG tablet Take 440 mg by mouth daily as needed (pain).    [provider]  oxyCODONE-acetaminophen (PERCOCET/ROXICET) 5-325 MG tablet Take 1 tablet by mouth every 4 (four) hours as needed for severe pain. 05/22/17   Lavera Guise, MD  traMADol (ULTRAM) 50 MG tablet Take 1 tablet (50 mg total) by mouth every 12 (twelve) hours as needed for severe pain. 09/02/17   Caccavale, Sophia, PA-C    Family History Family History  Family history unknown: Yes    Social History Social History   Tobacco Use  . Smoking status: Current Every Day Smoker    Packs/day: 0.25    Types: Cigarettes  . Smokeless tobacco: Never Used  Substance Use Topics  . Alcohol use: Yes    Comment: RARE  . Drug use: No     Allergies   Morphine and related; Vicodin [hydrocodone-acetaminophen]; and Sulfa antibiotics   Review of Systems Review of Systems  All other systems reviewed and  are negative.    Physical Exam Updated Vital Signs BP 125/73 (BP Location: Right Arm)   Pulse 72   Temp 98 F (36.7 C) (Oral)   Resp 18   SpO2 100%   Physical Exam  Constitutional: She appears well-developed and well-nourished. No distress.  HENT:  Head: Atraumatic.  Right Ear: External ear normal.  Left Ear: External ear normal.  Nose: Nose normal.  Mouth/Throat: Oropharynx is clear and moist. No oropharyngeal exudate.  Eyes: Conjunctivae are normal.  Neck: Normal range of motion. Neck supple. No tracheal deviation present. No thyromegaly present.  No nuchal rigidity  Cardiovascular: Normal rate and regular rhythm.  Pulmonary/Chest: Effort normal and breath sounds normal.  Abdominal: Soft. There is no tenderness.  Lymphadenopathy:    She has cervical  adenopathy.  Neurological: She is alert.  Skin: No rash noted.  Psychiatric: She has a normal mood and affect.  Nursing note and vitals reviewed.    ED Treatments / Results  Labs (all labs ordered are listed, but only abnormal results are displayed) Labs Reviewed  I-STAT CHEM 8, ED - Abnormal; Notable for the following components:      Result Value   TCO2 21 (*)    All other components within normal limits  RAPID STREP SCREEN ([facility] & [facility] ONLY)  CBC WITH DIFFERENTIAL/PLATELET    EKG None  Radiology No results found.  Procedures Procedures (including critical care time)  Medications Ordered in ED Medications - No data to display   Initial Impression / Assessment and Plan / ED Course  I have reviewed the triage vital signs and the nursing notes.  Pertinent labs & imaging results that were available during my care of the patient were reviewed by me and considered in my medical decision making (see chart for details).     BP 125/73 (BP Location: Right Arm)   Pulse 72   Temp 98 F (36.7 C) (Oral)   Resp 18   SpO2 100%    Final Clinical Impressions(s) / ED Diagnoses   Final diagnoses:  None    ED Discharge Orders    None     4:34 PM Patient here with painful lymph nodes on her neck suggestive of lymphadenitis.  Her condition has been ongoing for nearly 2 months without resolution.  Throat exam unremarkable, no fever however patient does have history of tobacco abuse, endorsed weight loss as well as night sweats therefore she would benefit from a neck CT with contrast to rule out malignancy.  Will obtain rapid strep test to assess for potential underlying infection although my suspicion is low for that.  5:08 PM Pt sign out to Arthor Captain, PA-C who will f/u on CT scan and decide disposition.    Fayrene Helper, PA-C 11/24/17 1712    Raeford Razor, MD 11/24/17 [phone]

## 2018-01-31 ENCOUNTER — Other Ambulatory Visit: Payer: Self-pay

## 2018-01-31 ENCOUNTER — Emergency Department (HOSPITAL_COMMUNITY)
Admission: EM | Admit: 2018-01-31 | Discharge: 2018-01-31 | Disposition: A | Discharge status: Home / Self Care | Payer: Managed Care, Other (non HMO) | Attending: Emergency Medicine | Admitting: Emergency Medicine

## 2018-01-31 ENCOUNTER — Encounter (HOSPITAL_COMMUNITY): Payer: Self-pay

## 2018-01-31 ENCOUNTER — Emergency Department (HOSPITAL_COMMUNITY): Payer: Managed Care, Other (non HMO)

## 2018-01-31 DIAGNOSIS — F17210 Nicotine dependence, cigarettes, uncomplicated: Secondary | ICD-10-CM | POA: Diagnosis not present

## 2018-01-31 DIAGNOSIS — Z79899 Other long term (current) drug therapy: Secondary | ICD-10-CM | POA: Insufficient documentation

## 2018-01-31 DIAGNOSIS — B9689 Other specified bacterial agents as the cause of diseases classified elsewhere: Secondary | ICD-10-CM

## 2018-01-31 DIAGNOSIS — J019 Acute sinusitis, unspecified: Secondary | ICD-10-CM | POA: Diagnosis not present

## 2018-01-31 DIAGNOSIS — J45909 Unspecified asthma, uncomplicated: Secondary | ICD-10-CM | POA: Insufficient documentation

## 2018-01-31 DIAGNOSIS — R05 Cough: Secondary | ICD-10-CM | POA: Diagnosis present

## 2018-01-31 LAB — URINALYSIS, ROUTINE W REFLEX MICROSCOPIC
BILIRUBIN URINE: NEGATIVE
GLUCOSE, UA: NEGATIVE mg/dL
HGB URINE DIPSTICK: NEGATIVE
KETONES UR: NEGATIVE mg/dL
Leukocytes, UA: NEGATIVE
Nitrite: NEGATIVE
Protein, ur: NEGATIVE mg/dL
Specific Gravity, Urine: 1.011 (ref 1.005–1.030)
pH: 6 (ref 5.0–8.0)

## 2018-01-31 LAB — COMPREHENSIVE METABOLIC PANEL
ALBUMIN: 4.1 g/dL (ref 3.5–5.0)
ALK PHOS: 87 U/L (ref 38–126)
ALT: 25 U/L (ref 0–44)
AST: 21 U/L (ref 15–41)
Anion gap: 8 (ref 5–15)
BUN: 16 mg/dL (ref 6–20)
CALCIUM: 9.7 mg/dL (ref 8.9–10.3)
CHLORIDE: 108 mmol/L (ref 98–111)
CO2: 24 mmol/L (ref 22–32)
CREATININE: 0.79 mg/dL (ref 0.44–1.00)
GFR calc Af Amer: >60 mL/min (ref >60)
GFR calc non Af Amer: >60 mL/min (ref >60)
Glucose, Bld: 106 mg/dL — ABNORMAL HIGH (ref 70–99)
Potassium: 3.8 mmol/L (ref 3.5–5.1)
SODIUM: 140 mmol/L (ref 135–145)
Total Bilirubin: 0.6 mg/dL (ref 0.3–1.2)
Total Protein: 7.3 g/dL (ref 6.5–8.1)

## 2018-01-31 LAB — CBC WITH DIFFERENTIAL/PLATELET
Abs Immature Granulocytes: 0 10*3/uL (ref 0.0–0.1)
BASOS PCT: 1 %
Basophils Absolute: 0 10*3/uL (ref 0.0–0.1)
EOS ABS: 0.1 10*3/uL (ref 0.0–0.7)
EOS PCT: 2 %
HCT: 44.6 % (ref 36.0–46.0)
Hemoglobin: 14.3 g/dL (ref 12.0–15.0)
IMMATURE GRANULOCYTES: 0 %
Lymphocytes Relative: 27 %
Lymphs Abs: 1.8 10*3/uL (ref 0.7–4.0)
MCH: 30.2 pg (ref 26.0–34.0)
MCHC: 32.1 g/dL (ref 30.0–36.0)
MCV: 94.3 fL (ref 78.0–100.0)
MONO ABS: 0.5 10*3/uL (ref 0.1–1.0)
MONOS PCT: 7 %
Neutro Abs: 4.2 10*3/uL (ref 1.7–7.7)
Neutrophils Relative %: 63 %
Platelets: 316 10*3/uL (ref 150–400)
RBC: 4.73 MIL/uL (ref 3.87–5.11)
RDW: 12.7 % (ref 11.5–15.5)
WBC: 6.6 10*3/uL (ref 4.0–10.5)

## 2018-01-31 MED ORDER — AMOXICILLIN-POT CLAVULANATE 875-125 MG PO TABS: 1.0000 | ORAL_TABLET | Freq: Two times a day (BID) | ORAL | 0 refills | Status: DC

## 2018-01-31 MED ORDER — FLUTICASONE PROPIONATE 50 MCG/ACT NA SUSP: 2.0000 | Freq: Every day | NASAL | 0 refills | Status: DC

## 2018-01-31 MED ORDER — BENZONATATE 100 MG PO CAPS: 100.0000 mg | ORAL_CAPSULE | Freq: Three times a day (TID) | ORAL | 0 refills | Status: DC

## 2018-01-31 NOTE — Discharge Instructions (Signed)
You were given a prescription for antibiotics. Please take the antibiotic prescription fully.   You were also given a perception for Flonase for your nasal congestion and Tessalon for your cough.  Please take all medications as prescribed.  Please discuss with your pharmacist with regards to starting new medications to make sure there are no interactions with the current medications that you may be taking now.  Please follow up with your primary care provider within 5-7 days for re-evaluation of your symptoms. If you do not have a primary care provider, information for a healthcare clinic has been provided for you to make arrangements for follow up care. Please return to the emergency department for any new or worsening symptoms.

## 2018-01-31 NOTE — ED Notes (Signed)
Patient did not answer when called x 3 in lobby 

## 2018-01-31 NOTE — ED Provider Notes (Signed)
[facility] EMERGENCY DEPARTMENT Provider Note   CSN: [id number] Arrival date & time: 01/31/18  1143     History   Chief Complaint Chief Complaint  Patient presents with  . Cough    HPI Lori Watkins is a 57 y.o. female.  HPI   Pt is a 57 y/o female with a h/o allergies, arthritis, asthma, borderline diabetes, GERD, who presents to the ED today complaining of a productive cough for the last week.  She also reports nasal congestion, sinus pressure, sinus pain that has been present for the last 2 weeks.  She also reports right-sided neck pain and right trapezius pain that is worse with movement.  Also complains of sore throat and bilateral ear fullness.  Denies any neck stiffness.  She has had intermittent "sticky "headaches that have been mild.  Symptoms improve after Motrin.  She denies any current headaches.  She denies shortness of breath, chest pain, abdominal pain, nausea, vomiting, diarrhea, constipation or urinary symptoms.  Her last dose of Motrin was at 10 AM this morning.  She has had body aches, sweats and chills. Reports temp at home no higher than 101F.   She states that she has had meningitis in the past and was admitted to [facility] for this. She denies that her neck pain or symptoms feel similar to when she had meningitis.   PCP: Dr. Chesley Mires in [place].   Past Medical History:  Diagnosis Date  . Allergy   . Arthritis   . Asthma   . Diabetes (HCC)    borderline  . GERD (gastroesophageal reflux disease)   . Seasonal allergies     There are no active problems to display for this patient.   Past Surgical History:  Procedure Laterality Date  . APPENDECTOMY    . CESAREAN SECTION    . CHOLECYSTECTOMY    . SPINE SURGERY     tumor removed  . TOTAL ABDOMINAL HYSTERECTOMY       OB History   None      Home Medications    Prior to Admission medications   Medication Sig Start Date End Date Taking? Authorizing Provider    amoxicillin-clavulanate (AUGMENTIN) 875-125 MG tablet Take 1 tablet by mouth every 12 (twelve) hours. 01/31/18   Albina Gosney S, PA-C  benzonatate (TESSALON) 100 MG capsule Take 1 capsule (100 mg total) by mouth every 8 (eight) hours. 01/31/18   Shery Wauneka S, PA-C  doxycycline (VIBRAMYCIN) 100 MG capsule Take 1 capsule (100 mg total) by mouth 2 (two) times daily. Patient not taking: Reported on 11/24/2017 09/02/17   Caccavale, Sophia, PA-C  erythromycin ophthalmic ointment Place a 1/2 inch ribbon of ointment into the lower eyelid. Patient not taking: Reported on 11/24/2017 08/28/17   Kellie Shropshire, PA-C  esomeprazole (NEXIUM) 20 MG capsule Take 20 mg by mouth 2 (two) times daily.    [provider]  fluticasone (FLONASE) 50 MCG/ACT nasal spray Place 2 sprays into both nostrils daily. 01/31/18   Ladamien Rammel S, PA-C  meloxicam (MOBIC) 15 MG tablet Take 1 tablet (15 mg total) by mouth daily. Take 1 daily with food. 11/24/17   Harris, Cammy Copa, PA-C  naproxen sodium (ALEVE) 220 MG tablet Take 440 mg by mouth daily as needed (for pain).     [provider]  oxyCODONE-acetaminophen (PERCOCET/ROXICET) 5-325 MG tablet Take 1 tablet by mouth every 4 (four) hours as needed for severe pain. Patient not taking: Reported on 11/24/2017 05/22/17   Verdie Mosher,  Neysa Bonitoana Duo, MD  traMADol (ULTRAM) 50 MG tablet Take 1 tablet (50 mg total) by mouth every 12 (twelve) hours as needed for severe pain. Patient not taking: Reported on 11/24/2017 09/02/17   Alveria Apleyaccavale, Sophia, PA-C    Family History Family History  Family history unknown: Yes    Social History Social History   Tobacco Use  . Smoking status: Current Every Day Smoker    Packs/day: 0.25    Types: Cigarettes  . Smokeless tobacco: Never Used  Substance Use Topics  . Alcohol use: Yes    Comment: RARE  . Drug use: No     Allergies   Morphine and related; Vicodin [hydrocodone-acetaminophen]; and Sulfa antibiotics   Review of  Systems Review of Systems  Constitutional: Negative for chills and fever.  HENT: Positive for congestion, sinus pressure, sinus pain and sore throat. Negative for ear discharge and rhinorrhea.   Eyes: Negative for pain and visual disturbance.  Respiratory: Positive for cough. Negative for shortness of breath.   Cardiovascular: Negative for chest pain, palpitations and leg swelling.  Gastrointestinal: Negative for abdominal pain, constipation, diarrhea, nausea and vomiting.  Genitourinary: Negative for dysuria, flank pain, frequency, hematuria and urgency.  Musculoskeletal: Positive for neck pain. Negative for back pain and neck stiffness.  Skin: Negative for color change and rash.  Neurological: Positive for headaches (resolved). Negative for dizziness, weakness, light-headedness and numbness.  All other systems reviewed and are negative.  Physical Exam Updated Vital Signs BP (!) 136/99 (BP Location: Right Arm)   Pulse 62   Temp 98.3 F (36.8 C) (Oral)   Resp 18   Ht 5\' 3"  (1.6 m)   Wt 65.3 kg (144 lb)   SpO2 100%   BMI 25.51 kg/m   Physical Exam  Constitutional: She is oriented to person, place, and time. She appears well-developed and well-nourished. No distress.  HENT:  Head: Normocephalic and atraumatic.  Lateral TMs without erythema or effusion.  No pharyngeal erythema or tonsillar swelling.  No tonsillar exudates.  Uvula midline.  Moist mucous membranes.  Nose normal.  Tenderness to the bilateral maxillary sinuses.  No frontal sinus tenderness.  Eyes: Pupils are equal, round, and reactive to light. Conjunctivae and EOM are normal.  No nystagmus.  Neck: Normal range of motion. Neck supple.  No nuchal rigidity.  Negative Kernig's.  Negative Brudzinski.  Mild tenderness to the right cervical paraspinous muscles and along the right trapezius muscle.  No cervical midline tenderness.  Cardiovascular: Normal rate, regular rhythm, normal heart sounds and intact distal pulses.  No  murmur heard. Pulmonary/Chest: Effort normal and breath sounds normal. No stridor. No respiratory distress. She has no wheezes.  Abdominal: Soft. Bowel sounds are normal. She exhibits no distension. There is no tenderness. There is no guarding.  Musculoskeletal: She exhibits no edema.  Neurological: She is alert and oriented to person, place, and time.  Mental Status:  Alert, thought content appropriate, able to give a coherent history. Speech fluent without evidence of aphasia. Able to follow 2 step commands without difficulty.  Cranial Nerves:  II:  pupils equal, round, reactive to light III,IV, VI: ptosis not present, extra-ocular motions intact bilaterally  V,VII: smile symmetric, facial light touch sensation equal VIII: hearing grossly normal to voice  X: uvula elevates symmetrically  XI: bilateral shoulder shrug symmetric and strong XII: midline tongue extension without fassiculations Motor:  Normal tone. 5/5 strength of BUE and BLE major muscle groups including strong and equal grip strength and dorsiflexion/plantar flexion Sensory:  light touch normal in all extremities. CV: 2+ radial and DP/PT pulses  Skin: Skin is warm and dry. Capillary refill takes less than 2 seconds.  Psychiatric: She has a normal mood and affect.  Nursing note and vitals reviewed.    ED Treatments / Results  Labs (all labs ordered are listed, but only abnormal results are displayed) Labs Reviewed  COMPREHENSIVE METABOLIC PANEL - Abnormal; Notable for the following components:      Result Value   Glucose, Bld 106 (*)    All other components within normal limits  CBC WITH DIFFERENTIAL/PLATELET  URINALYSIS, ROUTINE W REFLEX MICROSCOPIC    EKG None  Radiology Dg Chest 2 View  Result Date: 01/31/2018 CLINICAL DATA:  Cough and fever EXAM: CHEST - 2 VIEW COMPARISON:  None. FINDINGS: Lungs are clear. Heart size and pulmonary vascularity are normal. No adenopathy. No bone lesions. There are surgical  clips in the right upper quadrant of the abdomen. IMPRESSION: No edema or consolidation. Electronically Signed   By: Bretta Bang III M.D.   On: 01/31/2018 12:49    Procedures Procedures (including critical care time)  Medications Ordered in ED Medications - No data to display   Initial Impression / Assessment and Plan / ED Course  I have reviewed the triage vital signs and the nursing notes.  Pertinent labs & imaging results that were available during my care of the patient were reviewed by me and considered in my medical decision making (see chart for details).     Final Clinical Impressions(s) / ED Diagnoses   Final diagnoses:  Acute bacterial rhinosinusitis   Patient complaining of symptoms of sinusitis.    Severe symptoms have been present for greater than 10 days with purulent nasal discharge and maxillary sinus pain.  Concern for acute bacterial rhinosinusitis.  Patient discharged with Augmentin.  Instructions given for warm saline nasal wash and recommendations for follow-up with primary care physician.  We will also give Rx for Flonase and benzonatate for her cough.  Her chest x-ray shows no signs of pneumonia.  Signs of meningitis on exam.  Her neurologic exam is within normal limits.  No nuchal rigidity.  Negative Kernig's and Brudzinski.  Patient is afebrile.  She is nontoxic and nonseptic appearing.  Her lab work is benign she has no leukocytosis or other lab abnormalities. UA is negative. Advised her to take medications as advised and make an appointment with her PCP for follow-up.  Advised return to the ER if she has any worsening symptoms or symptoms that are consistent with her prior diagnosis of meningitis.  She agrees with the plan and understands.  All questions answered.   ED Discharge Orders        Ordered    amoxicillin-clavulanate (AUGMENTIN) 875-125 MG tablet  Every 12 hours     01/31/18 1621    benzonatate (TESSALON) 100 MG capsule  Every 8 hours      01/31/18 1621    fluticasone (FLONASE) 50 MCG/ACT nasal spray  Daily     01/31/18 [address], Jaamal Farooqui S, PA-C 01/31/18 1622    Bethann Berkshire, MD 02/01/18 [phone]

## 2018-01-31 NOTE — ED Triage Notes (Signed)
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 Afebrile.

## 2018-07-27 IMAGING — CT CT NECK W/ CM
5 of 6 series · 14 of 33 positions shown, 16 images · IV contrast (APPLIED)
Comparison: None.

CLINICAL DATA: Initial evaluation for palpable nodules to posterior
neck for 6 weeks, pain.

EXAM:
CT NECK WITH CONTRAST
TECHNIQUE: Multidetector CT imaging of the neck was performed using the
standard protocol following the bolus administration of intravenous
contrast.
CONTRAST:  75mL OMNIPAQUE IOHEXOL 300 MG/ML  SOLN

[Series 3: axial neck · axial · 0.45mm/px · z∈[-182,-106]mm · 2 of 115 slices shown, 3 images]
[im 39/115  soft-tissue]
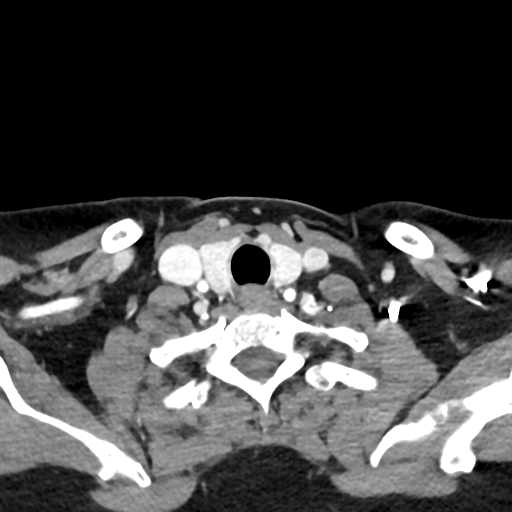
[im 39/115  bone]
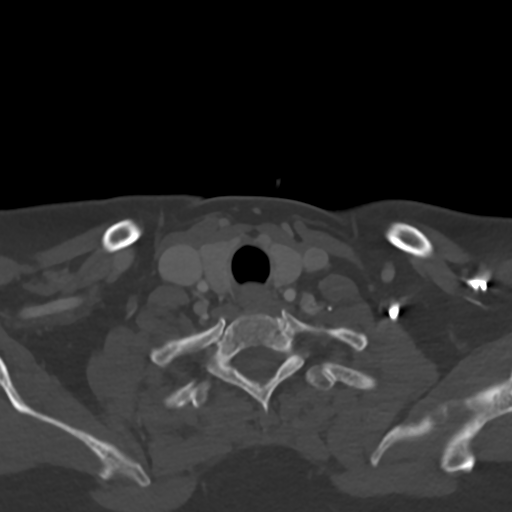
[im 77/115  bone]
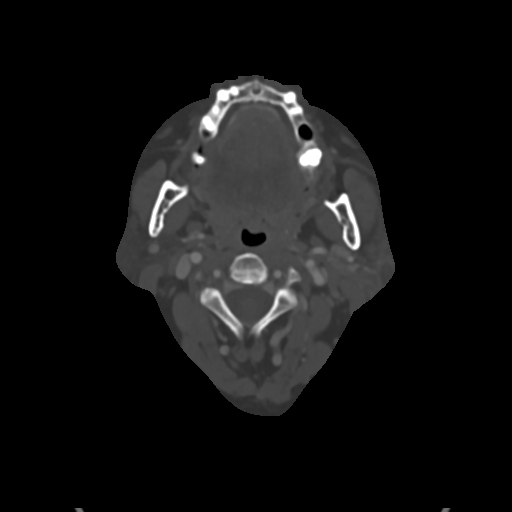

[Series 4: axial bone · axial · 0.45mm/px · z∈[-182,-106]mm · 2 of 115 slices shown]
[im 39/115  bone]
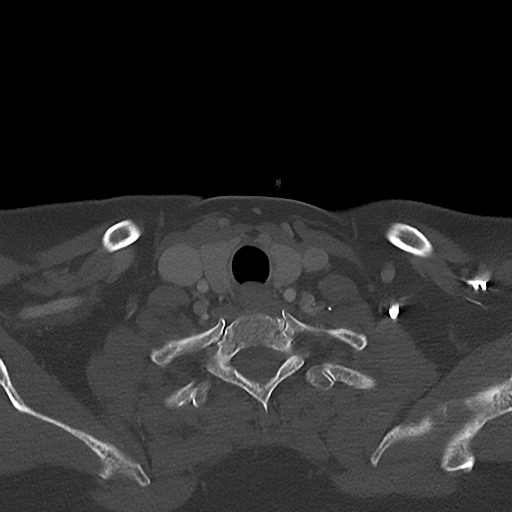
[im 77/115  bone]
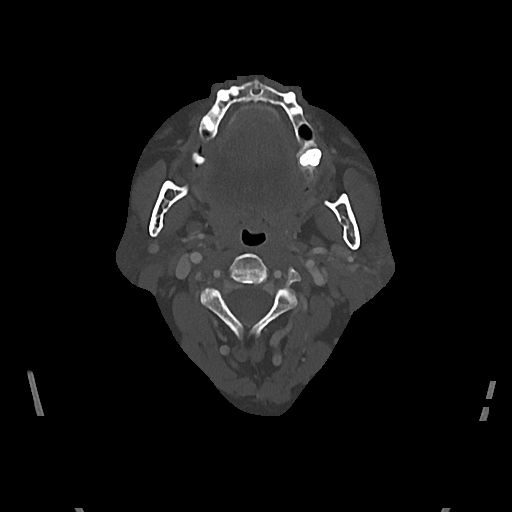

[Series 6: sag neck · sagittal · 0.42mm/px · 5 of 101 slices shown, 6 images]
[im 34/101  bone]
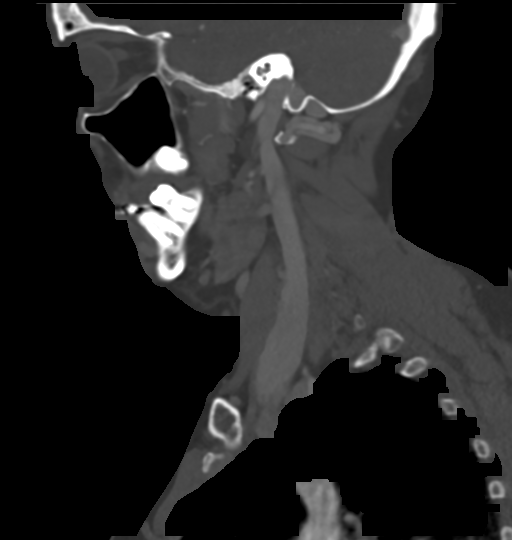
[im 42/101  bone]
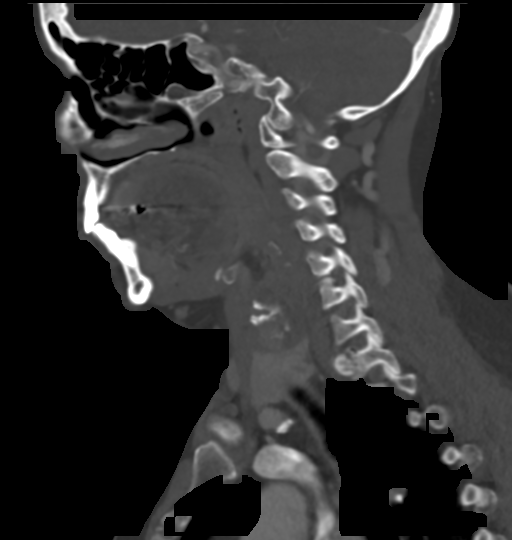
[im 51/101  soft-tissue]
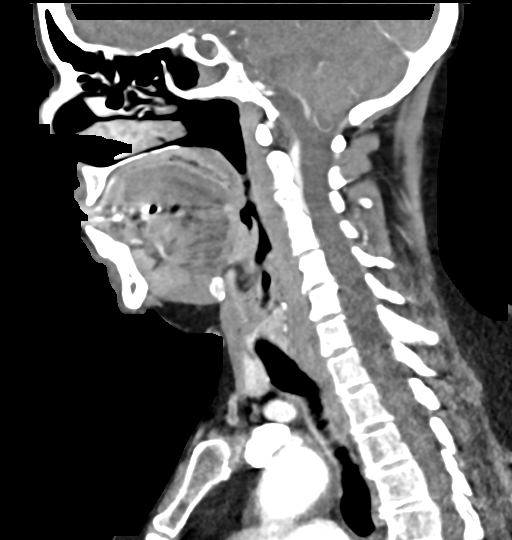
[im 51/101  bone]
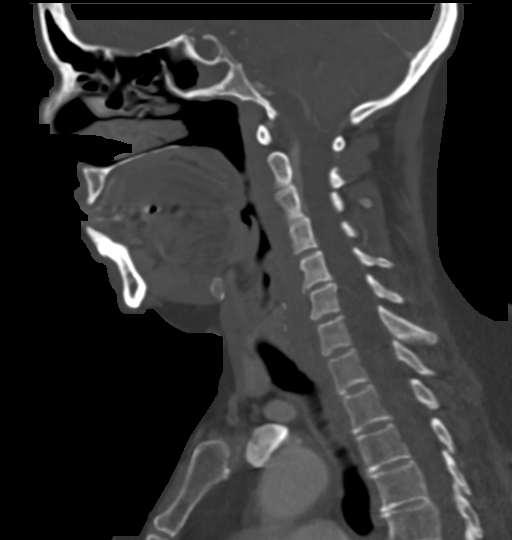
[im 59/101  bone]
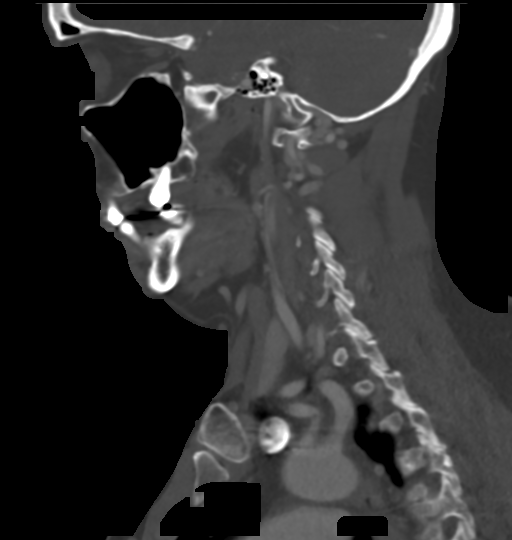
[im 67/101  bone]
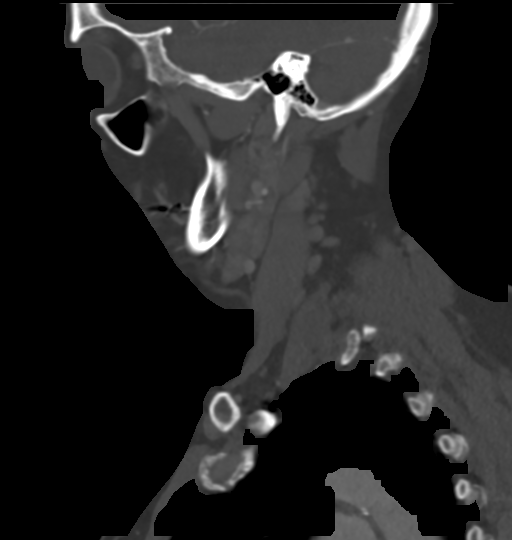

[Series 7: cor neck · coronal · 0.35mm/px · 3 of 94 slices shown]
[im 19/94  bone]
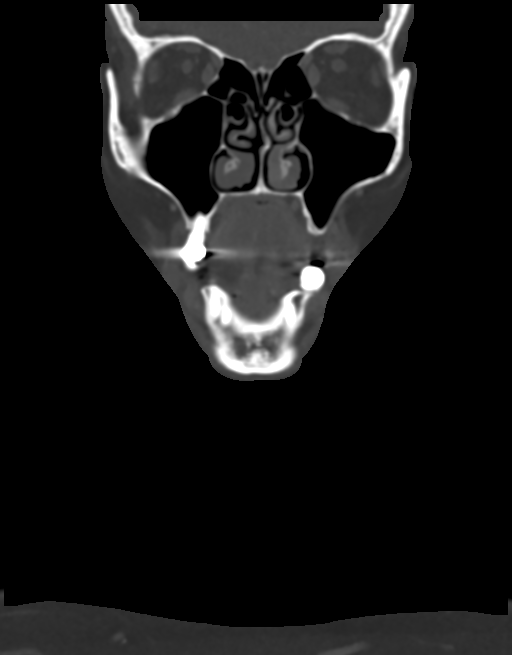
[im 38/94  bone]
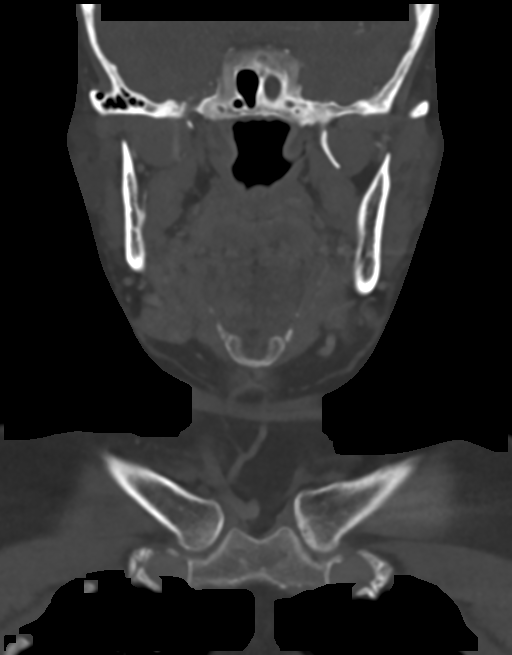
[im 56/94  bone]
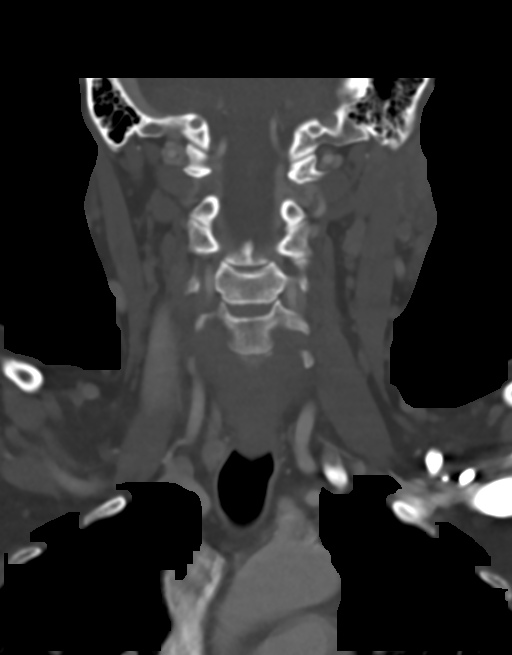

[Series 8: ax oropharynx · axial · 0.39mm/px · z∈[-183,-109]mm · 2 of 113 slices shown]
[im 38/113  bone]
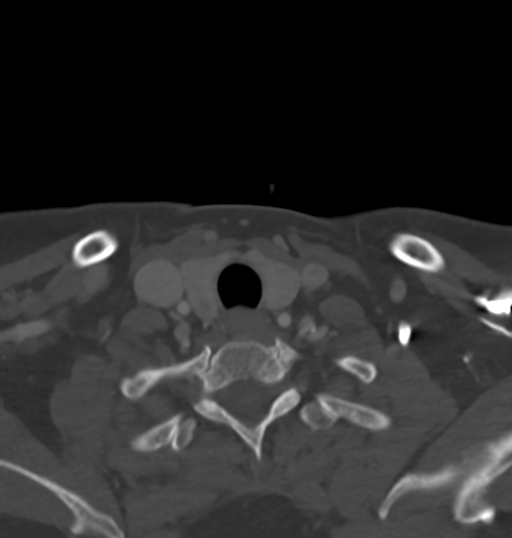
[im 75/113  bone]
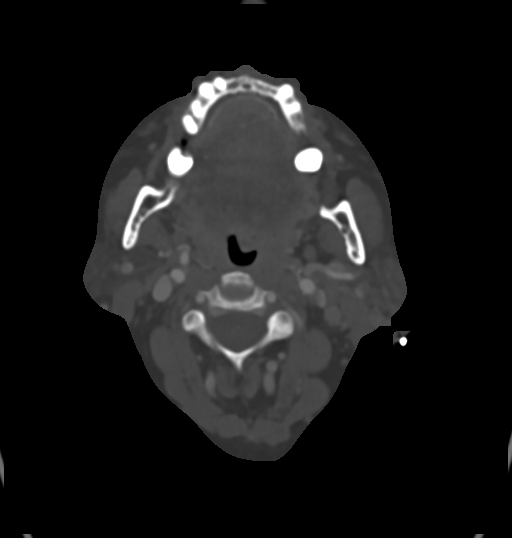

[14 of 33 positions shown; findings below may reference images not displayed]

FINDINGS: Pharynx and larynx: Oral cavity within normal limits without mass
lesion or loculated fluid collection. No acute abnormality about the
remaining dentition. Palatine tonsils symmetric and normal.
Parapharyngeal fat preserved. Nasopharynx within normal limits. No
retropharyngeal collection. Epiglottis normal. Vallecula largely
effaced and not well evaluated. Remainder of the hypopharynx and
supraglottic larynx within normal limits. True cords symmetric and
normal. Subglottic airway clear.

Salivary glands: Metallic BB marker overlies the posterior left
parotid gland at site of concern. The left parotid gland is mildly
enlarged and hyper dense as compared to the right parotid gland with
heterogeneous appearance, suggesting possible acute parotitis. No
significant inflammatory changes within the adjacent left parotid
space. No obstructive stones identified.

Remainder of the salivary glands including the right parotid gland
and submandibular glands are normal.

Thyroid: Subcentimeter hypodense right thyroid nodule noted, of
doubtful significance. Thyroid otherwise unremarkable.

Lymph nodes: No pathologically enlarged lymph nodes identified
within the neck.

Vascular: Normal intravascular enhancement seen throughout the neck.
Vascular calcifications noted about the left carotid bifurcation.

Limited intracranial: Unremarkable.

Visualized orbits: Globes and orbital soft tissues within normal
limits.

Mastoids and visualized paranasal sinuses: Acute on chronic left
sphenoid sinusitis. Paranasal sinuses are otherwise clear. Mastoid
air cells and middle ear cavities are clear.

Skeleton: No acute osseous abnormality. No worrisome lytic or
blastic osseous lesions. Few scattered dental caries noted.

Upper chest: Upper chest within normal limits. Hazy atelectatic
changes noted within the visualized lungs. Visualized lungs are
otherwise clear.

Other: None.
IMPRESSION: 1. Palpable abnormality of concern overlies the left parotid gland
which is mildly enlarged and hyper dense as compared to the right
gland, suggesting acute parotitis. No obstructive stone or other
complication identified.
2. Acute on chronic left sphenoid sinusitis.

## 2018-09-02 ENCOUNTER — Other Ambulatory Visit: Payer: Self-pay

## 2018-09-02 ENCOUNTER — Ambulatory Visit (HOSPITAL_COMMUNITY)
Admission: EM | Admit: 2018-09-02 | Discharge: 2018-09-02 | Disposition: A | Discharge status: Home / Self Care | Payer: Managed Care, Other (non HMO) | Attending: Internal Medicine | Admitting: Internal Medicine

## 2018-09-02 ENCOUNTER — Encounter (HOSPITAL_COMMUNITY): Payer: Self-pay | Admitting: Emergency Medicine

## 2018-09-02 DIAGNOSIS — K047 Periapical abscess without sinus: Secondary | ICD-10-CM

## 2018-09-02 MED ORDER — AMOXICILLIN-POT CLAVULANATE 875-125 MG PO TABS: 1.0000 | ORAL_TABLET | Freq: Two times a day (BID) | ORAL | 0 refills | Status: AC

## 2018-09-02 MED ORDER — HYDROCODONE-ACETAMINOPHEN 5-325 MG PO TABS: 1.0000 | ORAL_TABLET | Freq: Four times a day (QID) | ORAL | 0 refills | Status: DC | PRN

## 2018-09-02 NOTE — ED Provider Notes (Addendum)
[facility]    CSN: [id number] Arrival date & time: 09/02/18  1226     History   Chief Complaint Chief Complaint  Patient presents with  . Dental Pain    HPI Lori Watkins is a 58 y.o. female history of asthma, DM type II, GERD, presenting today for evaluation of dental pain and swelling.  Patient states that last week she fractured a tooth on her right lower jaw eating a chicken wing.  Over the past couple days she has had worsening pain, woke up this morning with significant swelling and pain around this area.  She does not have dental insurance currently as she recently started a new job.  Denies difficulty moving neck.  Denies fevers.  Pain radiating to right ear and head.  HPI  Past Medical History:  Diagnosis Date  . Allergy   . Arthritis   . Asthma   . Diabetes (HCC)    borderline  . GERD (gastroesophageal reflux disease)   . Seasonal allergies     There are no active problems to display for this patient.   Past Surgical History:  Procedure Laterality Date  . APPENDECTOMY    . CESAREAN SECTION    . CHOLECYSTECTOMY    . SPINE SURGERY     tumor removed  . TOTAL ABDOMINAL HYSTERECTOMY      OB History   No obstetric history on file.      Home Medications    Prior to Admission medications   Medication Sig Start Date End Date Taking? Authorizing Provider  amoxicillin-clavulanate (AUGMENTIN) 875-125 MG tablet Take 1 tablet by mouth every 12 (twelve) hours for 7 days. 09/02/18 09/09/18  Wieters, Hallie C, PA-C  benzonatate (TESSALON) 100 MG capsule Take 1 capsule (100 mg total) by mouth every 8 (eight) hours. 01/31/18   Couture, Cortni S, PA-C  HYDROcodone-acetaminophen (NORCO/VICODIN) 5-325 MG tablet Take 1 tablet by mouth every 6 (six) hours as needed for severe pain. 09/02/18   Wieters, Junius CreamerHallie C, PA-C    Family History Family History  Family history unknown: Yes    Social History Social History   Tobacco Use  . Smoking status: Current Every  Day Smoker    Packs/day: 0.25    Types: Cigarettes  . Smokeless tobacco: Never Used  Substance Use Topics  . Alcohol use: Yes    Comment: RARE  . Drug use: No     Allergies   Morphine and related; Vicodin [hydrocodone-acetaminophen]; and Sulfa antibiotics   Review of Systems Review of Systems  Constitutional: Negative for activity change, appetite change, chills, fatigue and fever.  HENT: Positive for dental problem and facial swelling. Negative for congestion, ear pain, rhinorrhea, sinus pressure, sore throat and trouble swallowing.   Eyes: Negative for discharge and redness.  Respiratory: Negative for cough, chest tightness and shortness of breath.   Cardiovascular: Negative for chest pain.  Gastrointestinal: Negative for abdominal pain, diarrhea, nausea and vomiting.  Musculoskeletal: Negative for myalgias, neck pain and neck stiffness.  Skin: Negative for rash.  Neurological: Negative for dizziness, light-headedness and headaches.     Physical Exam Triage Vital Signs ED Triage Vitals  Enc Vitals Group     BP 09/02/18 1325 134/80     Pulse Rate 09/02/18 1325 62     Resp 09/02/18 1325 16     Temp 09/02/18 1325 (!) 97.5 F (36.4 C)     Temp Source 09/02/18 1325 Temporal     SpO2 09/02/18 1325 100 %  Weight --      Height --      Head Circumference --      Peak Flow --      Pain Score 09/02/18 1324 10     Pain Loc --      Pain Edu? --      Excl. in [place]? --    No data found.  Updated Vital Signs BP 134/80 (BP Location: Right Arm)   Pulse 62   Temp (!) 97.5 F (36.4 C) (Temporal)   Resp 16   SpO2 100%   Visual Acuity Right Eye Distance:   Left Eye Distance:   Bilateral Distance:    Right Eye Near:   Left Eye Near:    Bilateral Near:     Physical Exam Vitals signs and nursing note reviewed.  Constitutional:      General: She is not in acute distress.    Appearance: She is well-developed.  HENT:     Head: Normocephalic and atraumatic.      Comments: Moderate facial swelling to right lower jaw visible, tender to palpation over this area    Ears:     Comments: Bilateral ears without tenderness to palpation of external auricle, tragus and mastoid, EAC's without erythema or swelling, TM's with good bony landmarks and cone of light. Non erythematous.    Mouth/Throat:     Comments: Poor dentition, right lower tooth fractured with surrounding gingival swelling, erythema and tenderness No soft palate swelling Eyes:     Conjunctiva/sclera: Conjunctivae normal.  Neck:     Musculoskeletal: Neck supple.     Comments: Full active range of motion of neck, no overlying neck swelling or erythema Cardiovascular:     Rate and Rhythm: Normal rate and regular rhythm.     Heart sounds: No murmur.  Pulmonary:     Effort: Pulmonary effort is normal. No respiratory distress.     Breath sounds: Normal breath sounds.  Abdominal:     Palpations: Abdomen is soft.     Tenderness: There is no abdominal tenderness.  Skin:    General: Skin is warm and dry.  Neurological:     Mental Status: She is alert.      UC Treatments / Results  Labs (all labs ordered are listed, but only abnormal results are displayed) Labs Reviewed - No data to display  EKG None  Radiology No results found.  Procedures Procedures (including critical care time)  Medications Ordered in UC Medications - No data to display  Initial Impression / Assessment and Plan / UC Course  I have reviewed the triage vital signs and the nursing notes.  Pertinent labs & imaging results that were available during my care of the patient were reviewed by me and considered in my medical decision making (see chart for details).     Patient with dental abscess, will treat with Augmentin twice daily x1 week, Tylenol and ibuprofen for mild to moderate pain, did provide a couple days worth of hydrocodone to use for severe pain/nighttime pain.  Discussed drowsiness regarding this advised  not to drive or work after taking.  Patient gets itching with this, but denies any difficulty breathing and has tolerated previously.Discussed strict return precautions. Patient verbalized understanding and is agreeable with plan.  Final Clinical Impressions(s) / UC Diagnoses   Final diagnoses:  Dental abscess     Discharge Instructions     Please use dental resource to contact offices to seek permenant treatment/relief.   Today we have  given you an antibiotic. This should help with pain as any infection is cleared.   For pain please take 600mg -800mg  of Ibuprofen every 8 hours, take with 1000 mg of Tylenol Extra strength every 8 hours. These are safe to take together. Please take with food.   I have also provided 2 days worth of stronger pain medication. This should only be used for severe pain. Do not drive or operate machinery while taking this medication.   Please return if you start to experience significant swelling of your face, experiencing fever.   ED Prescriptions    Medication Sig Dispense Auth. Provider   amoxicillin-clavulanate (AUGMENTIN) 875-125 MG tablet Take 1 tablet by mouth every 12 (twelve) hours for 7 days. 14 tablet Wieters, Hallie C, PA-C   HYDROcodone-acetaminophen (NORCO/VICODIN) 5-325 MG tablet Take 1 tablet by mouth every 6 (six) hours as needed for severe pain. 8 tablet Wieters, Lemoyne C, PA-C     Controlled Substance Prescriptions [place] Controlled Substance Registry consulted?  Registry checked, benefits outweigh risk.   Sharyon Cable Woodland C, PA-C 09/02/18 1404    Patterson Hammersmith C, PA-C 09/02/18 1410

## 2018-09-02 NOTE — ED Triage Notes (Signed)
The patient presented to the [facility] with a complaint of dental pain and swelling x 3 days.

## 2018-09-02 NOTE — Discharge Instructions (Signed)

## 2019-01-09 ENCOUNTER — Telehealth (HOSPITAL_COMMUNITY): Payer: Self-pay | Admitting: Emergency Medicine

## 2019-01-09 ENCOUNTER — Ambulatory Visit (HOSPITAL_COMMUNITY)
Admission: EM | Admit: 2019-01-09 | Discharge: 2019-01-09 | Disposition: A | Discharge status: Home / Self Care | Payer: Self-pay | Attending: Family Medicine | Admitting: Family Medicine

## 2019-01-09 ENCOUNTER — Other Ambulatory Visit: Payer: Self-pay

## 2019-01-09 ENCOUNTER — Encounter (HOSPITAL_COMMUNITY): Payer: Self-pay

## 2019-01-09 DIAGNOSIS — K029 Dental caries, unspecified: Secondary | ICD-10-CM

## 2019-01-09 DIAGNOSIS — K0889 Other specified disorders of teeth and supporting structures: Secondary | ICD-10-CM

## 2019-01-09 MED ORDER — AMOXICILLIN 875 MG PO TABS: 875.0000 mg | ORAL_TABLET | Freq: Two times a day (BID) | ORAL | 0 refills | Status: DC

## 2019-01-09 MED ORDER — HYDROCODONE-ACETAMINOPHEN 5-325 MG PO TABS: 1.0000 | ORAL_TABLET | Freq: Four times a day (QID) | ORAL | 0 refills | Status: DC | PRN

## 2019-01-09 NOTE — ED Provider Notes (Signed)
[facility]    CSN: [id number] Arrival date & time: 01/09/19  1804     History   Chief Complaint Chief Complaint  Patient presents with  . Dental Pain  . Facial Pain    HPI Lori Watkins is a 58 y.o. female.   This is an established most [facility] urgent care patient who is complaining about dental pain.  She was seen for the same problem last February.  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  The cold air is bothering her right cheek.  Symptoms began several days ago with right ear pain.  She developed the swelling today and the pain is localized in the right upper and lower jaw.  She has had no fever.  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  The pain is keeping her awake at night.  She would like a referral.  Patient has TMJ syndrome and has difficulty completely opening her mouth.     Past Medical History:  Diagnosis Date  . Allergy   . Arthritis   . Asthma   . Diabetes ([place])    borderline  . GERD (gastroesophageal reflux disease)   . Seasonal allergies     There are no active problems to display for this patient.   Past Surgical History:  Procedure Laterality Date  . APPENDECTOMY    . CESAREAN SECTION    . CHOLECYSTECTOMY    . SPINE SURGERY     tumor removed  . TOTAL ABDOMINAL HYSTERECTOMY      OB History   No obstetric history on file.      Home Medications    Prior to Admission medications   Medication Sig Start Date End Date Taking? Authorizing Provider  amoxicillin (AMOXIL) 875 MG tablet Take 1 tablet (875 mg total) by mouth 2 (two) times daily. 01/09/19   Robyn Haber, MD  HYDROcodone-acetaminophen (NORCO) 5-325 MG tablet Take 1 tablet by mouth every 6 (six) hours as needed for moderate pain. 01/09/19   Robyn Haber, MD    Family History Family History  Family history unknown: Yes    Social History Social History   Tobacco Use  . Smoking status: Current Every Day  Smoker    Packs/day: 0.25    Types: Cigarettes  . Smokeless tobacco: Never Used  Substance Use Topics  . Alcohol use: Yes    Comment: RARE  . Drug use: No     Allergies   Morphine and related, Vicodin [hydrocodone-acetaminophen], and Sulfa antibiotics   Review of Systems Review of Systems  HENT: Positive for dental problem and facial swelling.   All other systems reviewed and are negative.    Physical Exam Triage Vital Signs ED Triage Vitals  Enc Vitals Group     BP 01/09/19 1822 136/90     Pulse Rate 01/09/19 1822 77     Resp 01/09/19 1822 17     Temp 01/09/19 1822 98 F (36.7 C)     Temp Source 01/09/19 1822 Oral     SpO2 01/09/19 1822 94 %     Weight --      Height --      Head Circumference --      Peak Flow --      Pain Score 01/09/19 1823 10     Pain Loc --      Pain Edu? --      Excl. in [place]? --    No data found.  Updated Vital Signs BP 136/90 (BP Location: Left Arm)   Pulse 77   Temp 98 F (36.7 C) (Oral)   Resp 17   SpO2 94%    Physical Exam Vitals signs reviewed.  Constitutional:      Appearance: Normal appearance.  HENT:     Head:     Comments: Unable to fully open jaw    Right Ear: Tympanic membrane, ear canal and external ear normal.     Left Ear: Tympanic membrane, ear canal and external ear normal.     Mouth/Throat:     Mouth: Mucous membranes are moist.     Comments: Mild right cheek swelling with numerous caries upper and lower right side Eyes:     Conjunctiva/sclera: Conjunctivae normal.  Neck:     Musculoskeletal: Normal range of motion and neck supple.  Cardiovascular:     Pulses: Normal pulses.  Pulmonary:     Effort: Pulmonary effort is normal.  Musculoskeletal: Normal range of motion.  Skin:    General: Skin is warm.  Neurological:     General: No focal deficit present.     Mental Status: She is alert and oriented to person, place, and time.  Psychiatric:        Mood and Affect: Mood normal.      UC Treatments  / Results  Labs (all labs ordered are listed, but only abnormal results are displayed) Labs Reviewed - No data to display  EKG None  Radiology No results found.  Procedures Procedures (including critical care time)  Medications Ordered in UC Medications - No data to display  Initial Impression / Assessment and Plan / UC Course  I have reviewed the triage vital signs and the nursing notes.  Pertinent labs & imaging results that were available during my care of the patient were reviewed by me and considered in my medical decision making (see chart for details).    Final Clinical Impressions(s) / UC Diagnoses   Final diagnoses:  Dental caries  Pain, dental     Discharge Instructions     Please make an appointment with the oral surgeon above    ED Prescriptions    Medication Sig Dispense Auth. Provider   amoxicillin (AMOXIL) 875 MG tablet Take 1 tablet (875 mg total) by mouth 2 (two) times daily. 20 tablet Elvina SidleLauenstein, Reeanna Acri, MD   HYDROcodone-acetaminophen (NORCO) 5-325 MG tablet Take 1 tablet by mouth every 6 (six) hours as needed for moderate pain. 12 tablet Elvina SidleLauenstein, Shauntay Brunelli, MD     Controlled Substance Prescriptions [place] Controlled Substance Registry consulted? Not Applicable   Elvina SidleLauenstein, Torence Palmeri, MD 01/09/19 [phone]

## 2019-01-09 NOTE — Discharge Instructions (Addendum)
Please make an appointment with the oral surgeon above

## 2019-01-09 NOTE — ED Triage Notes (Signed)
Pt presents with right side dental pain and facial swelling.

## 2019-05-10 ENCOUNTER — Other Ambulatory Visit: Payer: Self-pay

## 2019-05-10 ENCOUNTER — Emergency Department (HOSPITAL_COMMUNITY)
Admission: EM | Admit: 2019-05-10 | Discharge: 2019-05-10 | Disposition: A | Discharge status: Home / Self Care | Payer: Self-pay | Attending: Emergency Medicine | Admitting: Emergency Medicine

## 2019-05-10 ENCOUNTER — Emergency Department (HOSPITAL_COMMUNITY): Payer: Self-pay

## 2019-05-10 DIAGNOSIS — J45909 Unspecified asthma, uncomplicated: Secondary | ICD-10-CM | POA: Insufficient documentation

## 2019-05-10 DIAGNOSIS — F17210 Nicotine dependence, cigarettes, uncomplicated: Secondary | ICD-10-CM | POA: Insufficient documentation

## 2019-05-10 DIAGNOSIS — Y9389 Activity, other specified: Secondary | ICD-10-CM | POA: Insufficient documentation

## 2019-05-10 DIAGNOSIS — Y990 Civilian activity done for income or pay: Secondary | ICD-10-CM | POA: Insufficient documentation

## 2019-05-10 DIAGNOSIS — Y92512 Supermarket, store or market as the place of occurrence of the external cause: Secondary | ICD-10-CM | POA: Insufficient documentation

## 2019-05-10 DIAGNOSIS — M7711 Lateral epicondylitis, right elbow: Secondary | ICD-10-CM | POA: Insufficient documentation

## 2019-05-10 DIAGNOSIS — M545 Low back pain: Secondary | ICD-10-CM | POA: Insufficient documentation

## 2019-05-10 DIAGNOSIS — R200 Anesthesia of skin: Secondary | ICD-10-CM | POA: Insufficient documentation

## 2019-05-10 DIAGNOSIS — X500XXA Overexertion from strenuous movement or load, initial encounter: Secondary | ICD-10-CM | POA: Insufficient documentation

## 2019-05-10 MED ORDER — PREDNISONE 20 MG PO TABS: ORAL_TABLET | ORAL | 0 refills | Status: DC

## 2019-05-10 MED ORDER — DICLOFENAC SODIUM 1 % TD GEL: 2.0000 g | Freq: Four times a day (QID) | TRANSDERMAL | 0 refills | Status: DC

## 2019-05-10 NOTE — ED Triage Notes (Signed)
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 Also reports L lower and upper back pain that has been going on for a while and is intermittent. Ambulatory with steady gait. No relief with Aleve.

## 2019-05-10 NOTE — ED Provider Notes (Signed)
[place] EM[facility] DEPART[facility] Provider Note   CSN: [id number] Arrival date & time: 05/10/19  1138     History   Chief Complaint Chief Complaint  Patient presents with  . Elbow Pain  . Back Pain    HPI Analys Lori Watkins is a 58 y.o. female.     The history is provided by the patient. No language interpreter was used.  Back Pain Associated symptoms: numbness   Associated symptoms: no fever      58 year old female with history of diabetes, arthritis, asthma, prior spine surgery presenting complaining of right arm discomfort.  Patient report for the past 5 to 6 days she has had persistent pain as well as tingling numbness sensation which started at her right elbow and radiates to the ulnar aspect of her right forearm and affecting her middle, ring, and pinky finger on the right hand.  Pain is described as a sharp and shooting sensation, now pain is shooting up towards her right upper arm and shoulder but not to her neck.  States pain is 9 out of 10, not adequately improved despite using over-the-counter lidocaine cream, Tiger balm, and other over-the-counter medication.  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  She is however left-hand dominant.  She also complaining of chronic pain to her lower back but her primary concern is her right arm pain.  She denies any specific injury.  She denies any fever no history of IV drug use, no chest pain or trouble breathing no rash and she denies any weakness.  Past Medical History:  Diagnosis Date  . Allergy   . Arthritis   . Asthma   . Diabetes ([place])    borderline  . GERD (gastroesophageal reflux disease)   . Seasonal allergies     There are no active problems to display for this patient.   Past Surgical History:  Procedure Laterality Date  . APPENDECTOMY    . CESAREAN SECTION    . CHOLECYSTECTOMY    . SPINE SURGERY     tumor removed  . TOTAL  ABDOMINAL HYSTERECTOMY       OB History   No obstetric history on file.      Home Medications    Prior to Admission medications   Medication Sig Start Date End Date Taking? Authorizing Provider  amoxicillin (AMOXIL) 875 MG tablet Take 1 tablet (875 mg total) by mouth 2 (two) times daily. 01/09/19   Robyn Haber, MD  HYDROcodone-acetaminophen (NORCO) 5-325 MG tablet Take 1 tablet by mouth every 6 (six) hours as needed for moderate pain. 01/09/19   Robyn Haber, MD    Family History Family History  Family history unknown: Yes    Social History Social History   Tobacco Use  . Smoking status: Current Every Day Smoker    Packs/day: 0.25    Types: Cigarettes  . Smokeless tobacco: Never Used  Substance Use Topics  . Alcohol use: Yes    Comment: RARE  . Drug use: No     Allergies   Morphine and related, Shellfish allergy, Vicodin [hydrocodone-acetaminophen], and Sulfa antibiotics   Review of Systems Review of Systems  Constitutional: Negative for fever.  Musculoskeletal: Positive for back pain.  Neurological: Positive for numbness.  All other systems reviewed and are negative.    Physical Exam Updated Vital Signs BP (!) 101/59 (BP Location: Right Arm)   Pulse 63   Temp 98.6  F (37 C) (Oral)   Resp 16   SpO2 99%   Physical Exam Vitals signs and nursing note reviewed.  Constitutional:      General: She is not in acute distress.    Appearance: She is well-developed.  HENT:     Head: Atraumatic.  Eyes:     Conjunctiva/sclera: Conjunctivae normal.  Neck:     Musculoskeletal: Neck supple.  Musculoskeletal:        General: Tenderness (Right arm: Tenderness to palpation of right elbow increased with elbow flexion, extension.  Pain along the right forearm and right deltoid.  No overlying skin changes.  Radial pulse 2+, normal grip strength.  Sensation intact throughout.) present.     Comments: No significant midline cervical spine or thoracic spine or  lumbar spine tenderness on exam.  Skin:    Capillary Refill: Capillary refill takes less than 2 seconds.     Findings: No rash.  Neurological:     Mental Status: She is alert and oriented to person, place, and time.      ED Treatments / Results  Labs (all labs ordered are listed, but only abnormal results are displayed) Labs Reviewed - No data to display  EKG None  Radiology No results found.  Procedures Procedures (including critical care time)  Medications Ordered in ED Medications - No data to display   Initial Impression / Assessment and Plan / ED Course  I have reviewed the triage vital signs and the nursing notes.  Pertinent labs & imaging results that were available during my care of the patient were reviewed by me and considered in my medical decision making (see chart for details).        BP (!) 101/59 (BP Location: Right Arm)   Pulse 63   Temp 98.6 F (37 C) (Oral)   Resp 16   SpO2 99%    Final Clinical Impressions(s) / ED Diagnoses   Final diagnoses:  Lateral epicondylitis of right elbow    ED Discharge Orders         Ordered    diclofenac sodium (VOLTAREN) 1 % GEL  4 times daily     05/10/19 1351    predniSONE (DELTASONE) 20 MG tablet     05/10/19 1351         1:25 PM Patient endorsed pain in her right elbow radiates down her right hand and now up her right arm for the past 6 days.  I suspect pain is likely ulnar radiculopathy given the distribution of her discomfort and tingling sensation.  She has normal grip strength, sensation is mostly intact, radial pulse 2+, forearm compartment is soft.  No overlying skin changes.  She has no neck discomfort.  Her back exam is unremarkable.  X-ray of her right elbow was unremarkable.  Vital signs stable.  I felt her pain is likely an ulnar radiculopathy given her job requiring heavy lifting.  I have low suspicion for cord compression, or stroke.  Will prescribe steroid encourage patient to continue with  over-the-counter NSAIDs.  Will refer to hand specialist for outpatient follow-up if no improvement.  Return precaution discussed.  Work note provided as requested.   Fayrene Helper, PA-C 05/10/19 1354    Alvira Monday, MD 05/11/19 [phone]

## 2019-05-10 NOTE — ED Notes (Signed)
Pt advising she has a ring on the affected hand but unable to get off and would prefer it not be removed. Ice applied to rt elbow.

## 2019-05-12 DIAGNOSIS — F329 Major depressive disorder, single episode, unspecified: Secondary | ICD-10-CM | POA: Insufficient documentation

## 2019-05-12 DIAGNOSIS — R45851 Suicidal ideations: Secondary | ICD-10-CM | POA: Insufficient documentation

## 2019-05-12 DIAGNOSIS — F10920 Alcohol use, unspecified with intoxication, uncomplicated: Secondary | ICD-10-CM | POA: Insufficient documentation

## 2019-05-12 DIAGNOSIS — Z20828 Contact with and (suspected) exposure to other viral communicable diseases: Secondary | ICD-10-CM | POA: Insufficient documentation

## 2019-05-12 DIAGNOSIS — F4325 Adjustment disorder with mixed disturbance of emotions and conduct: Secondary | ICD-10-CM | POA: Insufficient documentation

## 2019-05-13 ENCOUNTER — Other Ambulatory Visit: Payer: Self-pay

## 2019-05-13 ENCOUNTER — Emergency Department (HOSPITAL_COMMUNITY)
Admission: EM | Admit: 2019-05-13 | Discharge: 2019-05-13 | Disposition: A | Discharge status: Home / Self Care | Payer: Self-pay | Attending: Emergency Medicine | Admitting: Emergency Medicine

## 2019-05-13 DIAGNOSIS — F32A Depression, unspecified: Secondary | ICD-10-CM

## 2019-05-13 DIAGNOSIS — F10920 Alcohol use, unspecified with intoxication, uncomplicated: Secondary | ICD-10-CM

## 2019-05-13 DIAGNOSIS — F4325 Adjustment disorder with mixed disturbance of emotions and conduct: Secondary | ICD-10-CM

## 2019-05-13 DIAGNOSIS — F329 Major depressive disorder, single episode, unspecified: Secondary | ICD-10-CM

## 2019-05-13 LAB — RAPID URINE DRUG SCREEN, HOSP PERFORMED
Amphetamines: NOT DETECTED
Barbiturates: NOT DETECTED
Benzodiazepines: NOT DETECTED
Cocaine: NOT DETECTED
Opiates: NOT DETECTED
Tetrahydrocannabinol: POSITIVE — AB

## 2019-05-13 LAB — COMPREHENSIVE METABOLIC PANEL
ALT: 39 U/L (ref 0–44)
AST: 30 U/L (ref 15–41)
Albumin: 4.6 g/dL (ref 3.5–5.0)
Alkaline Phosphatase: 90 U/L (ref 38–126)
Anion gap: 16 — ABNORMAL HIGH (ref 5–15)
BUN: 15 mg/dL (ref 6–20)
CO2: 16 mmol/L — ABNORMAL LOW (ref 22–32)
Calcium: 9.7 mg/dL (ref 8.9–10.3)
Chloride: 109 mmol/L (ref 98–111)
Creatinine, Ser: 0.91 mg/dL (ref 0.44–1.00)
GFR calc Af Amer: >60 mL/min (ref >60)
GFR calc non Af Amer: >60 mL/min (ref >60)
Glucose, Bld: 110 mg/dL — ABNORMAL HIGH (ref 70–99)
Potassium: 3.8 mmol/L (ref 3.5–5.1)
Sodium: 141 mmol/L (ref 135–145)
Total Bilirubin: 0.5 mg/dL (ref 0.3–1.2)
Total Protein: 7.7 g/dL (ref 6.5–8.1)

## 2019-05-13 LAB — URINALYSIS, ROUTINE W REFLEX MICROSCOPIC
Bilirubin Urine: NEGATIVE
Glucose, UA: NEGATIVE mg/dL
Hgb urine dipstick: NEGATIVE
Ketones, ur: 5 mg/dL — AB
Leukocytes,Ua: NEGATIVE
Nitrite: NEGATIVE
Protein, ur: NEGATIVE mg/dL
Specific Gravity, Urine: 1.015 (ref 1.005–1.030)
pH: 5 (ref 5.0–8.0)

## 2019-05-13 LAB — I-STAT BETA HCG BLOOD, ED (MC, WL, AP ONLY): I-stat hCG, quantitative: <5 m[IU]/mL (ref <5)

## 2019-05-13 LAB — CBC
HCT: 46.5 % — ABNORMAL HIGH (ref 36.0–46.0)
Hemoglobin: 15.1 g/dL — ABNORMAL HIGH (ref 12.0–15.0)
MCH: 31.4 pg (ref 26.0–34.0)
MCHC: 32.5 g/dL (ref 30.0–36.0)
MCV: 96.7 fL (ref 80.0–100.0)
Platelets: 356 10*3/uL (ref 150–400)
RBC: 4.81 MIL/uL (ref 3.87–5.11)
RDW: 12.8 % (ref 11.5–15.5)
WBC: 12.7 10*3/uL — ABNORMAL HIGH (ref 4.0–10.5)
nRBC: 0 % (ref 0.0–0.2)

## 2019-05-13 LAB — ACETAMINOPHEN LEVEL: Acetaminophen (Tylenol), Serum: <10 ug/mL — ABNORMAL LOW (ref 10–30)

## 2019-05-13 LAB — SALICYLATE LEVEL: Salicylate Lvl: <7 mg/dL (ref 2.8–30.0)

## 2019-05-13 LAB — SARS CORONAVIRUS 2 (TAT 6-24 HRS): SARS Coronavirus 2: NEGATIVE

## 2019-05-13 LAB — ETHANOL: Alcohol, Ethyl (B): 126 mg/dL — ABNORMAL HIGH (ref <10)

## 2019-05-13 MED ORDER — LORAZEPAM 1 MG PO TABS
1.0000 mg | ORAL_TABLET | Freq: Once | ORAL | Status: AC
  Administered 2019-05-13: 1 mg via ORAL
  Filled 2019-05-13: qty 1

## 2019-05-13 MED ORDER — ACETAMINOPHEN 325 MG PO TABS
325.0000 mg | ORAL_TABLET | Freq: Once | ORAL | Status: AC
  Administered 2019-05-13: 325 mg via ORAL
  Filled 2019-05-13: qty 1

## 2019-05-13 MED ORDER — LORAZEPAM 2 MG/ML IJ SOLN: 1.0000 mg | Freq: Once | INTRAMUSCULAR | Status: DC

## 2019-05-13 NOTE — ED Notes (Signed)
Pt states even tho she allergic to hycodone she can take tylenol as she requested for H/a

## 2019-05-13 NOTE — [facility] Counselor (Addendum)
Patient seen with Dr. Dwyane Dee and TTS. Patient denies SI/HI/AVH. Patient provided with resources on outpatient therapy. Patient is psych cleared per Dr. Dwyane Dee. Lowella Petties, RN notified.

## 2019-05-13 NOTE — ED Provider Notes (Signed)
[facility] EMERGENCY DEPARTMENT Provider Note   CSN: [id number] Arrival date & time: 05/12/19  2346     History   Chief Complaint Chief Complaint  Patient presents with  . Suicidal    HPI Lori Watkins is a 58 y.o. female.     Patient to ED by [employer] who were called to her house while arguing with her husband. She reported to police that she was having suicidal ideation. Per [employer] report the patient locked herself in her bathroom and was cutting her wrist. The patient states to me that she feels overwhelmed because she has no help at home, her husband is an addict who does not work, her son is in the hospital, she has a painful injury but has to continue working, her landlord raised her rent recently. She had been drinking and felt overwhelmed. She denies SI currently.  The history is provided by the patient. No language interpreter was used.    Past Medical History:  Diagnosis Date  . Allergy   . Arthritis   . Asthma   . Diabetes (HCC)    borderline  . GERD (gastroesophageal reflux disease)   . Seasonal allergies     There are no active problems to display for this patient.   Past Surgical History:  Procedure Laterality Date  . APPENDECTOMY    . CESAREAN SECTION    . CHOLECYSTECTOMY    . SPINE SURGERY     tumor removed  . TOTAL ABDOMINAL HYSTERECTOMY       OB History   No obstetric history on file.      Home Medications    Prior to Admission medications   Medication Sig Start Date End Date Taking? Authorizing Provider  amoxicillin (AMOXIL) 875 MG tablet Take 1 tablet (875 mg total) by mouth 2 (two) times daily. 01/09/19   Elvina Sidle, MD  diclofenac sodium (VOLTAREN) 1 % GEL Apply 2 g topically 4 (four) times daily. 05/10/19   Fayrene Helper, PA-C  HYDROcodone-acetaminophen (NORCO) 5-325 MG tablet Take 1 tablet by mouth every 6 (six) hours as needed for moderate pain. 01/09/19   Elvina Sidle, MD  predniSONE (DELTASONE) 20 MG tablet 3  tabs po day one, then 2 tabs daily x 4 days 05/10/19   Fayrene Helper, PA-C    Family History Family History  Family history unknown: Yes    Social History Social History   Tobacco Use  . Smoking status: Current Every Day Smoker    Packs/day: 0.25    Types: Cigarettes  . Smokeless tobacco: Never Used  Substance Use Topics  . Alcohol use: Yes    Comment: RARE  . Drug use: No     Allergies   Morphine and related, Shellfish allergy, Vicodin [hydrocodone-acetaminophen], and Sulfa antibiotics   Review of Systems Review of Systems  HENT: Negative.   Respiratory: Negative.   Cardiovascular: Negative.   Gastrointestinal: Negative.   Musculoskeletal: Negative.   Skin: Negative.   Neurological: Negative.   Psychiatric/Behavioral:       See HPI.     Physical Exam Updated Vital Signs BP 117/77 (BP Location: Right Arm)   Pulse 80   Temp 97.8 F (36.6 C) (Oral)   Resp 18   SpO2 100%   Physical Exam Constitutional:      Appearance: She is well-developed.  Neck:     Musculoskeletal: Normal range of motion.  Pulmonary:     Effort: Pulmonary effort is normal.  Skin:    General:  Skin is warm and dry.  Neurological:     Mental Status: She is alert and oriented to person, place, and time.  Psychiatric:        Attention and Perception: Attention and perception normal.        Mood and Affect: Mood is depressed.        Behavior: Behavior is cooperative.      ED Treatments / Results  Labs (all labs ordered are listed, but only abnormal results are displayed) Labs Reviewed  COMPREHENSIVE METABOLIC PANEL - Abnormal; Notable for the following components:      Result Value   CO2 16 (*)    Glucose, Bld 110 (*)    Anion gap 16 (*)    All other components within normal limits  ETHANOL - Abnormal; Notable for the following components:   Alcohol, Ethyl (B) 126 (*)    All other components within normal limits  ACETAMINOPHEN LEVEL - Abnormal; Notable for the following  components:   Acetaminophen (Tylenol), Serum <10 (*)    All other components within normal limits  CBC - Abnormal; Notable for the following components:   WBC 12.7 (*)    Hemoglobin 15.1 (*)    HCT 46.5 (*)    All other components within normal limits  SALICYLATE LEVEL  RAPID URINE DRUG SCREEN, HOSP PERFORMED  I-STAT BETA HCG BLOOD, ED ([facility], [facility], [facility] ONLY)  I-STAT BETA HCG BLOOD, ED ([facility], [facility], [facility] ONLY)    EKG None  Radiology No results found.  Procedures Procedures (including critical care time)  Medications Ordered in ED Medications - No data to display   Initial Impression / Assessment and Plan / ED Course  I have reviewed the triage vital signs and the nursing notes.  Pertinent labs & imaging results that were available during my care of the patient were reviewed by me and considered in my medical decision making (see chart for details).        Patient to ED after gestures to harm herself earlier tonight at home by cutting her wrists (superficial wounds), feeling depressed and overwhelmed. Currently denies SI. She is here under IVC by the police officer called to the house.   She is seen by TTS and found to meet inpatient criteria. Placement is being sought.   I went to discuss the decision by [facility]/TTS to keep her in the hospital and she becomes very distraught and then angry, stating that she will lose her job, lose her apartment. She is considered a flight risk and 1-on-1 sitter is considered required on this patient.     Final Clinical Impressions(s) / ED Diagnoses   Final diagnoses:  None   1. SI 2. Depression  ED Discharge Orders    None       Charlann Lange, Hershal Coria 05/13/19 [place], Delice Bison, DO 05/13/19 [phone]

## 2019-05-13 NOTE — ED Notes (Signed)
Breakfast Ordered 

## 2019-05-13 NOTE — ED Notes (Signed)
Regular Diet was ordered for Lunch. 

## 2019-05-13 NOTE — ED Notes (Signed)
TTS set up at bedside. 

## 2019-05-13 NOTE — ED Notes (Signed)
Pt refuses to let me take her ring off. Ring is bent.

## 2019-05-13 NOTE — ED Notes (Signed)
Pt washed bilateral superficial arm lacs in sink.  Bacitracin applied to bilateral lacs and bandages applied.

## 2019-05-13 NOTE — ED Triage Notes (Signed)
Per [employer] pt was at home with husband and was arguing and went into bathroom and locked herself in and started cutting her wrist. Pt said her husband is on drugs and feels like she is doing it all. Pt told [employer] she wanted to kill herself.
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
## 2019-05-13 NOTE — Discharge Instructions (Addendum)
It was our pleasure to provide your ER care today - we hope that you feel better.  Follow up with primary care doctor, as well as outpatient counseling, in the coming week.  For mental health issues and/or crisis, you may go directly to [facility] or to [facility].   Return to ER right away if worse, new symptoms, severe depression, thoughts of harm to self or others, or other concern.

## 2019-05-13 NOTE — ED Notes (Signed)
Talking to husband on the phone, patient is very upset at being in the hospital, angry at husband, upset with being unable to leave. Discussed with EDPA orders for ativan PO or IM.

## 2019-05-13 NOTE — ED Provider Notes (Signed)
[place] team indicates patient has been re-evaluated by psychiatrist, Dr Dwyane Dee, and that patient is psychiatrically cleared for discharge.   Patient is alert, cooperative.  Normal mood and affect.  No hallucinations or delusions. Patient denies thoughts of harm to self or others, and feels she is ready for d/c to home.   Rec close out pcp and counseling f/u.  Patient currently appears stable for d/c.   Return precautions provided.      Lajean Saver, MD 05/13/19 1529

## 2020-01-14 ENCOUNTER — Ambulatory Visit: Payer: Self-pay | Admitting: *Deleted

## 2020-01-14 NOTE — Telephone Encounter (Signed)
Patient had COVID vaccine on Friday- patient started with arm pain for 1 day- patient got flu symptoms- body pain, sweating, weak, vomiting. Due to high risk of dehydration- advised ED now  Reason for Disposition . Sounds like a severe, unusual reaction to the triager  Answer Assessment - Initial Assessment Questions 1. MAIN CONCERN OR SYMPTOM:  "What is your main concern right now?" "What question do you have?" "What's the main symptom you're worried about?" (e.g., fever, pain, redness, swelling)     Sweating and vomiting 2. VACCINE: "What vaccination did you receive?" "Is this your first or second shot?" (e.g., none; AstraZeneca, J&J, Moderna, Pfizer, other)     Patient is not sure- she had the vaccine at [facility] and does not have her information close 3. SYMPTOM ONSET: "When did the symptoms begin?" (e.g., not relevant; hours, days)      1-2 days after the injection and has continued 4. SYMPTOM SEVERITY: "How bad is it?"      Patient is unable to eat/drink for 3 days 5. FEVER: "Is there a fever?" If so, ask: "What is it, how was it measured, and when did it start?"      Not sure- swaeting 6. PAST REACTIONS: "Have you reacted to immunizations before?" If so, ask: "What happened?"     no 7. OTHER SYMPTOMS: "Do you have any other symptoms?"     Weakness, body pain- flu like symptoms  Protocols used: CORONAVIRUS (COVID-19) VACCINE QUESTIONS AND REACTIONS-A-AH

## 2021-01-20 ENCOUNTER — Other Ambulatory Visit: Payer: Self-pay

## 2021-01-20 ENCOUNTER — Emergency Department (HOSPITAL_COMMUNITY)
Admission: EM | Admit: 2021-01-20 | Discharge: 2021-01-20 | Disposition: A | Discharge status: Home / Self Care | Payer: Self-pay | Attending: Emergency Medicine | Admitting: Emergency Medicine

## 2021-01-20 ENCOUNTER — Emergency Department (HOSPITAL_COMMUNITY): Payer: Self-pay

## 2021-01-20 ENCOUNTER — Encounter (HOSPITAL_COMMUNITY): Payer: Self-pay

## 2021-01-20 DIAGNOSIS — F17210 Nicotine dependence, cigarettes, uncomplicated: Secondary | ICD-10-CM | POA: Insufficient documentation

## 2021-01-20 DIAGNOSIS — R300 Dysuria: Secondary | ICD-10-CM | POA: Insufficient documentation

## 2021-01-20 DIAGNOSIS — N200 Calculus of kidney: Secondary | ICD-10-CM | POA: Insufficient documentation

## 2021-01-20 DIAGNOSIS — E119 Type 2 diabetes mellitus without complications: Secondary | ICD-10-CM | POA: Insufficient documentation

## 2021-01-20 DIAGNOSIS — J45909 Unspecified asthma, uncomplicated: Secondary | ICD-10-CM | POA: Insufficient documentation

## 2021-01-20 LAB — URINALYSIS, MICROSCOPIC (REFLEX)

## 2021-01-20 LAB — COMPREHENSIVE METABOLIC PANEL
ALT: 21 U/L (ref 0–44)
AST: 21 U/L (ref 15–41)
Albumin: 4.1 g/dL (ref 3.5–5.0)
Alkaline Phosphatase: 88 U/L (ref 38–126)
Anion gap: 10 (ref 5–15)
BUN: 14 mg/dL (ref 8–23)
CO2: 21 mmol/L — ABNORMAL LOW (ref 22–32)
Calcium: 9.8 mg/dL (ref 8.9–10.3)
Chloride: 100 mmol/L (ref 98–111)
Creatinine, Ser: 0.85 mg/dL (ref 0.44–1.00)
GFR, Estimated: >60 mL/min (ref >60)
Glucose, Bld: 133 mg/dL — ABNORMAL HIGH (ref 70–99)
Potassium: 3.7 mmol/L (ref 3.5–5.1)
Sodium: 131 mmol/L — ABNORMAL LOW (ref 135–145)
Total Bilirubin: 1 mg/dL (ref 0.3–1.2)
Total Protein: 7.3 g/dL (ref 6.5–8.1)

## 2021-01-20 LAB — URINALYSIS, ROUTINE W REFLEX MICROSCOPIC
Bilirubin Urine: NEGATIVE
Glucose, UA: NEGATIVE mg/dL
Hgb urine dipstick: NEGATIVE
Ketones, ur: NEGATIVE mg/dL
Nitrite: NEGATIVE
Protein, ur: NEGATIVE mg/dL
Specific Gravity, Urine: 1.025 (ref 1.005–1.030)
pH: 5.5 (ref 5.0–8.0)

## 2021-01-20 LAB — CBC
HCT: 46 % (ref 36.0–46.0)
Hemoglobin: 15.3 g/dL — ABNORMAL HIGH (ref 12.0–15.0)
MCH: 31.9 pg (ref 26.0–34.0)
MCHC: 33.3 g/dL (ref 30.0–36.0)
MCV: 95.8 fL (ref 80.0–100.0)
Platelets: 376 10*3/uL (ref 150–400)
RBC: 4.8 MIL/uL (ref 3.87–5.11)
RDW: 11.9 % (ref 11.5–15.5)
WBC: 9.1 10*3/uL (ref 4.0–10.5)
nRBC: 0 % (ref 0.0–0.2)

## 2021-01-20 MED ORDER — NAPROXEN 500 MG PO TABS: 500.0000 mg | ORAL_TABLET | Freq: Two times a day (BID) | ORAL | 0 refills | Status: DC | PRN

## 2021-01-20 MED ORDER — PHENAZOPYRIDINE HCL 200 MG PO TABS: 200.0000 mg | ORAL_TABLET | Freq: Three times a day (TID) | ORAL | 0 refills | Status: DC | PRN

## 2021-01-20 MED ORDER — SODIUM CHLORIDE 0.9 % IV SOLN
1.0000 g | Freq: Once | INTRAVENOUS | Status: AC
  Administered 2021-01-20: 1 g via INTRAVENOUS
  Filled 2021-01-20: qty 10

## 2021-01-20 MED ORDER — KETOROLAC TROMETHAMINE 15 MG/ML IJ SOLN
15.0000 mg | Freq: Once | INTRAMUSCULAR | Status: AC
  Administered 2021-01-20: 15 mg via INTRAVENOUS
  Filled 2021-01-20: qty 1

## 2021-01-20 MED ORDER — FENTANYL CITRATE (PF) 100 MCG/2ML IJ SOLN
50.0000 ug | Freq: Once | INTRAMUSCULAR | Status: AC
  Administered 2021-01-20: 50 ug via INTRAVENOUS
  Filled 2021-01-20: qty 2

## 2021-01-20 MED ORDER — ONDANSETRON HCL 4 MG/2ML IJ SOLN
4.0000 mg | Freq: Once | INTRAMUSCULAR | Status: AC
  Administered 2021-01-20: 4 mg via INTRAVENOUS
  Filled 2021-01-20: qty 2

## 2021-01-20 MED ORDER — ONDANSETRON 4 MG PO TBDP: 4.0000 mg | ORAL_TABLET | Freq: Three times a day (TID) | ORAL | 0 refills | Status: DC | PRN

## 2021-01-20 MED ORDER — CEPHALEXIN 500 MG PO CAPS: 500.0000 mg | ORAL_CAPSULE | Freq: Four times a day (QID) | ORAL | 0 refills | Status: DC

## 2021-01-20 MED ORDER — SODIUM CHLORIDE 0.9 % IV BOLUS
1000.0000 mL | Freq: Once | INTRAVENOUS | Status: AC
  Administered 2021-01-20: 1000 mL via INTRAVENOUS

## 2021-01-20 NOTE — Discharge Instructions (Addendum)
You were seen in the emergency department and found to have a kidney stone.  We are sending you home with multiple medications to assist with passing the stone:   -Naproxen-this is a medication that will help with pain as well as passing the stone.  Please take this every12 hours.  Take this with food as it can cause stomach upset and at worst stomach bleeding.  Do not take other NSAIDs such as Motrin, Aleve, Advil, Mobic, or Naproxen with this medicine as they are similar and would propagate any potential side effects.  - Pyridium-take every 8 hours as needed for lower abdominal pain.  This will turn your urine orange. -Zofran-this is an antinausea medication, you may take this every 8 hours as needed for nausea and vomiting, please allow the tablet to dissolve underneath of your tongue.  -Keflex: This is an antibiotic, take this 4 times per day for the next 1 week to treat for infection.  We have prescribed you new medication(s) today. Discuss the medications prescribed today with your pharmacist as they can have adverse effects and interactions with your other medicines including over the counter and prescribed medications. Seek medical evaluation if you start to experience new or abnormal symptoms after taking one of these medicines, seek care immediately if you start to experience difficulty breathing, feeling of your throat closing, facial swelling, or rash as these could be indications of a more serious allergic reaction  Please follow-up with the urology group provided in your discharge instructions within 3 to 5 days.  Return to the ER for new or worsening symptoms including but not limited to worsening pain not controlled by these medicines, inability to keep fluids down, fever, or any other concerns that you may have.

## 2021-01-20 NOTE — ED Notes (Addendum)
EDPA into room prior to RN assessment, see PA notes. Labs and CT results reviewed.

## 2021-01-20 NOTE — ED Provider Notes (Signed)
Emergency Medicine Provider Triage Evaluation Note  Lori Watkins , a 60 y.o. female  was evaluated in triage.  Pt complains of right-sided flank pain rating into the suprapubic region x1 week.  She endorses suprapubic fullness.  She has not had any appetite.  Endorses nausea and chills.  No objective fevers at home.  Remote history of kidney stones.  Review of Systems  Positive: Right-sided flank pain, suprapubic "fullness", nausea, diminished appetite, chills, dysuria. Negative: Fevers, chest pain, difficulty breathing  Physical Exam  BP (!) 146/99 (BP Location: Right Arm)   Pulse 78   Temp 98 F (36.7 C) (Oral)   Resp 18   SpO2 100%  Gen:   Awake, no distress   Resp:  Normal effort  MSK:   Moves extremities without difficulty  Other:    Medical Decision Making  Medically screening exam initiated at 9:34 AM.  Appropriate orders placed.  Lori Watkins was informed that the remainder of the evaluation will be completed by another provider, this initial triage assessment does not replace that evaluation, and the importance of remaining in the ED until their evaluation is complete.     Lorelee New, PA-C 01/20/21 [id number]    Pricilla Loveless, MD 01/20/21 1011

## 2021-01-20 NOTE — ED Triage Notes (Signed)
pt presents with Right flank pain x1 week. Associated with pain with urination, urgency, dysuria, some blood noted when she wiped 2 days ago. Reports a hx of kidney stones "years ago"

## 2021-01-20 NOTE — ED Provider Notes (Signed)
[facility] EMERGENCY DEPARTMENT Provider Note   CSN: [id number] Arrival date & time: 01/20/21  0240     History Chief Complaint  Patient presents with   Flank Pain    Lori Watkins is a 60 y.o. female with a hx of GERD, asthma, and borderline diabetes with prior appendectomy, cholecystectomy, and hysterectomy who presents to the emergency department with complaints of urinary symptoms for 5 days and right flank pain for the past 3 to 4 days.  Patient states that she is been having dysuria, frequency, & urgency with suprapubic pressure/spasms, she subsequently developed waxing and waning right flank pain.  She had associated nausea with multiple episodes of emesis per day.  She has not really eaten anything in the past few days.  She has been taking Tylenol without relief.  She denies fever, chills, hematemesis, melena, hematochezia, diarrhea, chest pain, or dyspnea.  She did notice a little bit of hematuria the other day but none since. HPI     Past Medical History:  Diagnosis Date   Allergy    Arthritis    Asthma    Diabetes (HCC)    borderline   GERD (gastroesophageal reflux disease)    Seasonal allergies     There are no problems to display for this patient.   Past Surgical History:  Procedure Laterality Date   APPENDECTOMY     CESAREAN SECTION     CHOLECYSTECTOMY     SPINE SURGERY     tumor removed   TOTAL ABDOMINAL HYSTERECTOMY       OB History   No obstetric history on file.     Family History  Family history unknown: Yes    Social History   Tobacco Use   Smoking status: Every Day    Packs/day: 0.25    Pack years: 0.00    Types: Cigarettes   Smokeless tobacco: Never  Vaping Use   Vaping Use: Never used  Substance Use Topics   Alcohol use: Yes    Comment: RARE   Drug use: No    Home Medications Prior to Admission medications   Medication Sig Start Date End Date Taking? Authorizing Provider  amoxicillin (AMOXIL) 875 MG  tablet Take 1 tablet (875 mg total) by mouth 2 (two) times daily. Patient not taking: Reported on 05/13/2019 01/09/19   Elvina Sidle, MD  diclofenac sodium (VOLTAREN) 1 % GEL Apply 2 g topically 4 (four) times daily. 05/10/19   Fayrene Helper, PA-C  HYDROcodone-acetaminophen (NORCO) 5-325 MG tablet Take 1 tablet by mouth every 6 (six) hours as needed for moderate pain. Patient not taking: Reported on 05/13/2019 01/09/19   Elvina Sidle, MD  predniSONE (DELTASONE) 20 MG tablet 3 tabs po day one, then 2 tabs daily x 4 days Patient not taking: Reported on 05/13/2019 05/10/19   Fayrene Helper, PA-C    Allergies    Morphine and related, Shellfish allergy, Vicodin [hydrocodone-acetaminophen], and Sulfa antibiotics  Review of Systems   Review of Systems  Constitutional:  Negative for fever.  Respiratory:  Negative for shortness of breath.   Cardiovascular:  Negative for chest pain.  Gastrointestinal:  Positive for abdominal pain, nausea and vomiting. Negative for constipation and diarrhea.  Genitourinary:  Positive for dysuria, flank pain, frequency, hematuria and urgency. Negative for vaginal discharge and vaginal pain.  Neurological:  Negative for syncope.  All other systems reviewed and are negative.  Physical Exam Updated Vital Signs BP (!) 146/99 (BP Location: Right Arm)   Pulse 78  Temp 98 F (36.7 C) (Oral)   Resp 18   SpO2 100%   Physical Exam Vitals and nursing note reviewed.  Constitutional:      General: She is not in acute distress.    Appearance: She is well-developed. She is not toxic-appearing.  HENT:     Head: Normocephalic and atraumatic.  Eyes:     General:        Right eye: No discharge.        Left eye: No discharge.     Conjunctiva/sclera: Conjunctivae normal.  Cardiovascular:     Rate and Rhythm: Normal rate and regular rhythm.  Pulmonary:     Effort: Pulmonary effort is normal. No respiratory distress.     Breath sounds: Normal breath sounds. No  wheezing, rhonchi or rales.  Abdominal:     General: There is no distension.     Palpations: Abdomen is soft.     Tenderness: There is abdominal tenderness (lower abdomen, more so suprapubic). There is right CVA tenderness. There is no left CVA tenderness, guarding or rebound.  Musculoskeletal:     Cervical back: Neck supple.  Skin:    General: Skin is warm and dry.     Findings: No rash.  Neurological:     Mental Status: She is alert.     Comments: Clear speech.   Psychiatric:        Behavior: Behavior normal.    ED Results / Procedures / Treatments   Labs (all labs ordered are listed, but only abnormal results are displayed) Labs Reviewed  URINALYSIS, ROUTINE W REFLEX MICROSCOPIC - Abnormal; Notable for the following components:      Result Value   APPearance HAZY (*)    Leukocytes,Ua MODERATE (*)    All other components within normal limits  CBC - Abnormal; Notable for the following components:   Hemoglobin 15.3 (*)    All other components within normal limits  COMPREHENSIVE METABOLIC PANEL - Abnormal; Notable for the following components:   Sodium 131 (*)    CO2 21 (*)    Glucose, Bld 133 (*)    All other components within normal limits  URINALYSIS, MICROSCOPIC (REFLEX) - Abnormal; Notable for the following components:   Bacteria, UA FEW (*)    All other components within normal limits    EKG None  Radiology CT Renal Stone Study  Result Date: 01/20/2021 CLINICAL DATA:  Right flank pain EXAM: CT ABDOMEN AND PELVIS WITHOUT CONTRAST TECHNIQUE: Multidetector CT imaging of the abdomen and pelvis was performed following the standard protocol without IV contrast. COMPARISON:  None. FINDINGS: Lower chest: No acute abnormality. Hepatobiliary: Limited without IV contrast. No large focal hepatic abnormality. No intrahepatic biliary dilatation. Remote cholecystectomy. Common bile duct nondilated. Pancreas: Unremarkable. No pancreatic ductal dilatation or surrounding inflammatory  changes. Spleen: Normal in size without focal abnormality. Adrenals/Urinary Tract: Normal adrenal glands. Left kidney and ureter demonstrate no acute process or obstruction. Right kidney demonstrates slight pelviectasis and some degree of associated proximal right hydroureter. As the right ureter courses into the pelvis, it is difficult to separate from adjacent iliac vasculature. The ureter does appear to course in close proximity of a tiny right hemipelvis punctate calcification measuring 3 mm, image 58/3. This could represent a right distal ureteral calculus in the pelvis but difficult to confirm because of poor separation from adjacent iliac calcifications. Urinary bladder unremarkable. Stomach/Bowel: Negative for bowel obstruction, significant dilatation, ileus, or free air. Previous appendectomy changes. No free fluid, fluid collection, hemorrhage,  hematoma, abscess or ascites. Minimal distal colon diverticulosis. No acute inflammatory process. Vascular/Lymphatic: Aortoiliac atherosclerosis. Limited without IV contrast. No aneurysm. No bulky adenopathy. Reproductive: Remote hysterectomy. No adnexal abnormality or pelvic free fluid. Other: No abdominal wall hernia or abnormality. No abdominopelvic ascites. Musculoskeletal: Degenerative changes of the thoracolumbar spine and lower lumbar facet joints. No acute osseous finding. IMPRESSION: Findings suspicious for a 3 mm right distal ureteral calculus in the right hemipelvis with minimal proximal right hydroureter. See above comment. Remote cholecystectomy, appendectomy and hysterectomy. Diverticulosis without acute inflammatory process Aortic Atherosclerosis (ICD10-I70.0). Electronically Signed   By: Judie PetitM.  Shick M.D.   On: 01/20/2021 11:11    Procedures Procedures   Medications Ordered in ED Medications - No data to display  ED Course  I have reviewed the triage vital signs and the nursing notes.  Pertinent labs & imaging results that were available  during my care of the patient were reviewed by me and considered in my medical decision making (see chart for details).    MDM Rules/Calculators/A&P                         Patient presents to the ED with complaints of urinary sxs with right flank pain. Patient is nontoxic, vitals with mildly elevated BP- doubt HTN emergency. Exam with R cva tenderness and lower abdominal tenderness w/o peritoneal signs.   Additional history obtained:  Additional history obtained from chart review & nursing note review.   Lab Tests:  I reviewed and interpreted labs, which included:  CBC: Mild hemoglobin elevation CMP: Mild hyponatremia and hyperglycemia, no significant electrolyte derangement. Urinalysis: Moderate leukocytes present with 11-20 WBCs, few bacteria, nitrite negative.    Imaging Studies ordered:  CT renal stone study was ordered in triage, I independently reviewed, formal radiology impression shows:  Findings suspicious for a 3 mm right distal ureteral calculus in the right hemipelvis with minimal proximal right hydroureter. See above comment. Remote cholecystectomy, appendectomy and hysterectomy. Diverticulosis without acute inflammatory process Aortic Atherosclerosis   ED Course:  Plan for symptomatic management including fluids, Zofran, and fentanyl.  12:48: CONSULT: Discussed with urologist Dr. Sherron MondayMacDiarmid- recommends culture & abx, okay to discharge home, in agreement with rocephin & cephalosporin to go home with  13:20: RE-EVAL: no change S/p fentanyl, will give toradol.   14:45: RE-EVAL: Patient is feeling much better, pain well controlled, she is tolerating p.o., she is ready to go home.  Will discharge home with supportive care.  We discussed pain management, given she has not tolerated narcotics well in the past through shared decision making will avoid these.  Provide naproxen, Zofran, and Pyridium for symptomatic management and antibiotics given her moderate leukocytes in her urine  with culture pending.  Urology follow-up.  I discussed results, treatment plan, need for follow-up, and return precautions with the patient. Provided opportunity for questions, patient confirmed understanding and is in agreement with plan.   Findings and plan of care discussed with supervising physician Dr. Donnald GarrePfeiffer who is in agreement.   Portions of this note were generated with Scientist, clinical (histocompatibility and immunogenetics)Dragon dictation software. Dictation errors may occur despite best attempts at proofreading.  Final Clinical Impression(s) / ED Diagnoses Final diagnoses:  Kidney stone    Rx / DC Orders ED Discharge Orders          Ordered    naproxen (NAPROSYN) 500 MG tablet  2 times daily PRN        01/20/21 1445    ondansetron (ZOFRAN  ODT) 4 MG disintegrating tablet  Every 8 hours PRN        01/20/21 1445    phenazopyridine (PYRIDIUM) 200 MG tablet  3 times daily PRN        01/20/21 1445    cephALEXin (KEFLEX) 500 MG capsule  4 times daily        01/20/21 1445             Carlesha Seiple, [place] R, PA-C 01/20/21 1448    Arby Barrette, MD 01/27/21 1034

## 2021-01-23 LAB — URINE CULTURE: Culture: 60000 — AB

## 2021-01-24 ENCOUNTER — Telehealth: Payer: Self-pay | Admitting: Emergency Medicine

## 2021-01-24 NOTE — Telephone Encounter (Signed)
Post ED Visit - Positive Culture Follow-up  Culture report reviewed by antimicrobial stewardship pharmacist: [facility] []  , Pharm.D. []  Enzo Bi, Pharm.D., BCPS AQ-ID []  , Pharm.D., BCPS []  Celedonio Miyamoto, Pharm.D., BCPS []  [place], Garvin Fila.D., BCPS, AAHIVP []  , Pharm.D., BCPS, AAHIVP []  Georgina Pillion, PharmD, BCPS []  , PharmD, BCPS []  [place], PharmD, BCPS [x]  [place], PharmD []  , PharmD, BCPS []  Estella Husk, PharmD  Pharmacy Team []  Lysle Pearl, PharmD []  , PharmD []  Phillips Climes, PharmD []  , Rph []  Agapito Games) , PharmD []  Delmar Landau, PharmD []  , PharmD []  Mervyn Gay, PharmD []  , PharmD []  Vinnie Level, PharmD []  [facility], PharmD []  , PharmD []  Len Childs, PharmD   Positive urine culture Treated with Cephalexin, organism sensitive to the same and no further patient follow-up is required at this time.  Rama Mcclintock 01/24/2021, 6:13 PM

## 2021-09-22 IMAGING — CT CT RENAL STONE PROTOCOL
2 of 4 series · 16 of 46 positions shown, 18 images · non-contrast
Comparison: None.

CLINICAL DATA: Right flank pain

EXAM:
CT ABDOMEN AND PELVIS WITHOUT CONTRAST
TECHNIQUE: Multidetector CT imaging of the abdomen and pelvis was performed
following the standard protocol without IV contrast.

[Series 3: renal stone 5.0 · axial · 0.86mm/px · z∈[+835,+1200]mm · 13 of 81 slices shown, 15 images]
[im 4/81  soft-tissue]
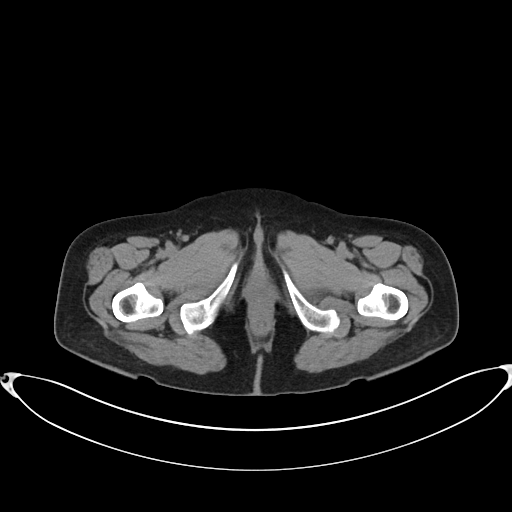
[im 4/81  bone]
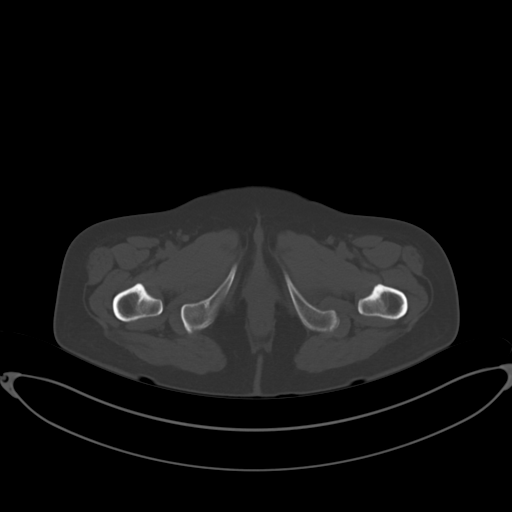
[im 11/81  soft-tissue]
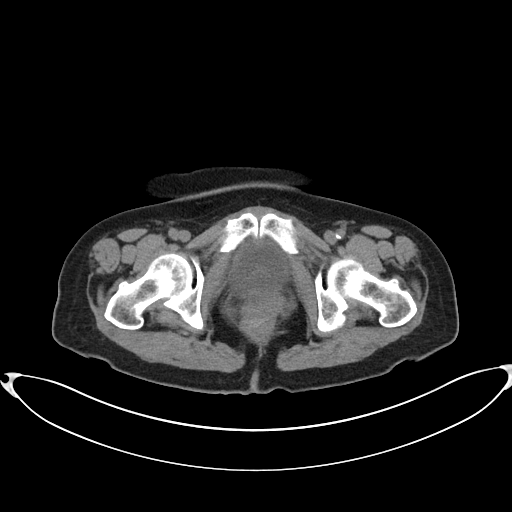
[im 17/81  soft-tissue]
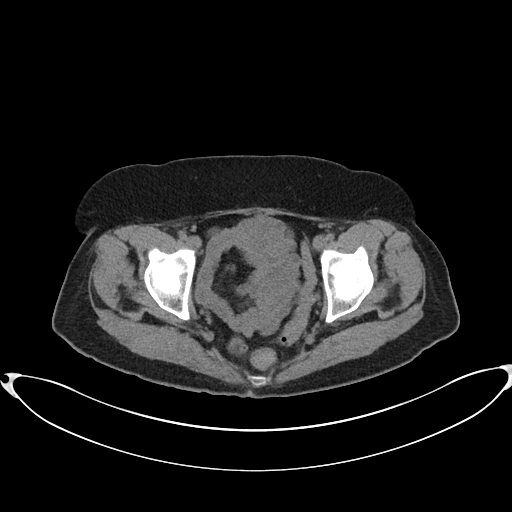
[im 24/81  soft-tissue]
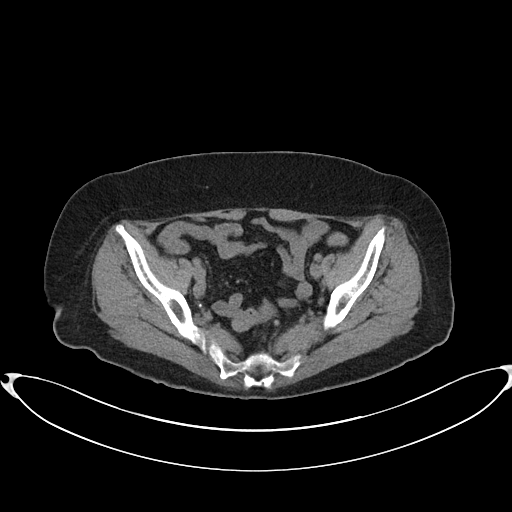
[im 27/81  soft-tissue]
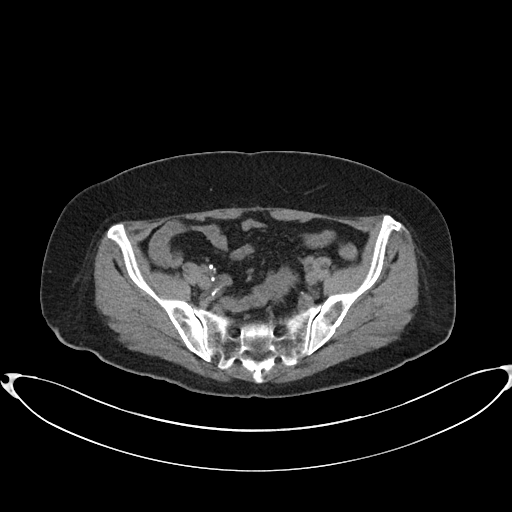
[im 34/81  soft-tissue]
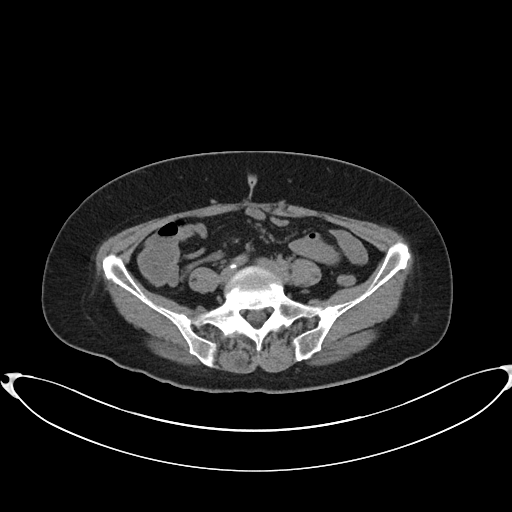
[im 41/81  soft-tissue]
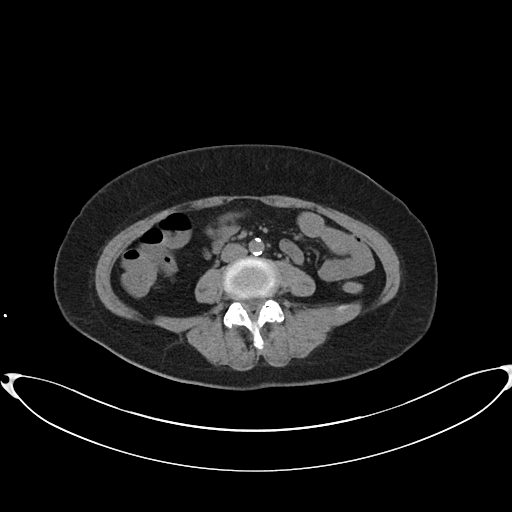
[im 47/81  soft-tissue]
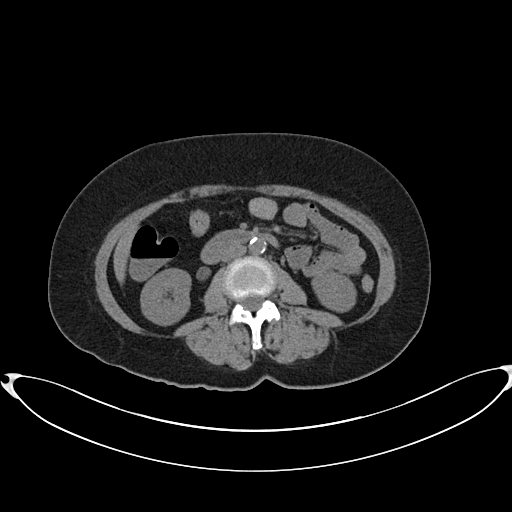
[im 54/81  soft-tissue]
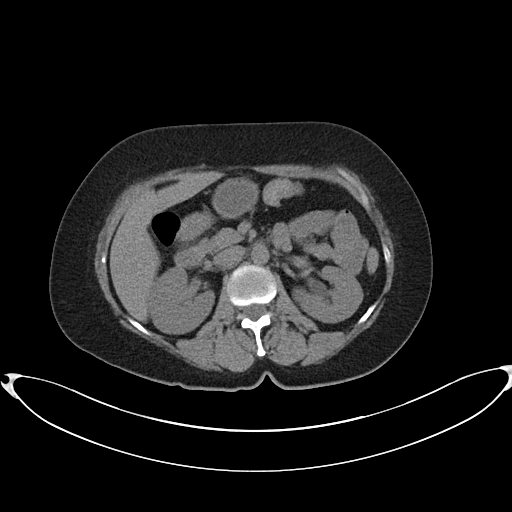
[im 54/81  bone]
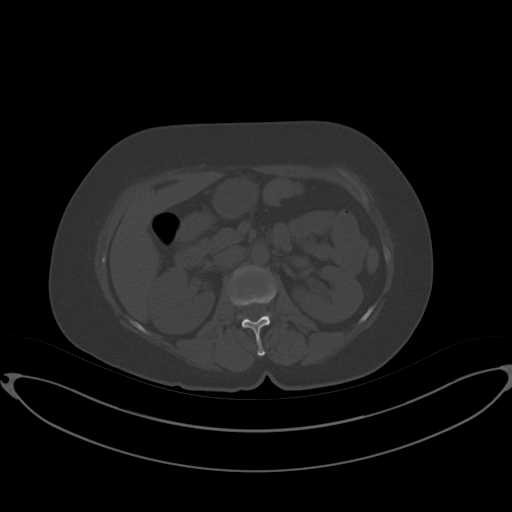
[im 57/81  soft-tissue]
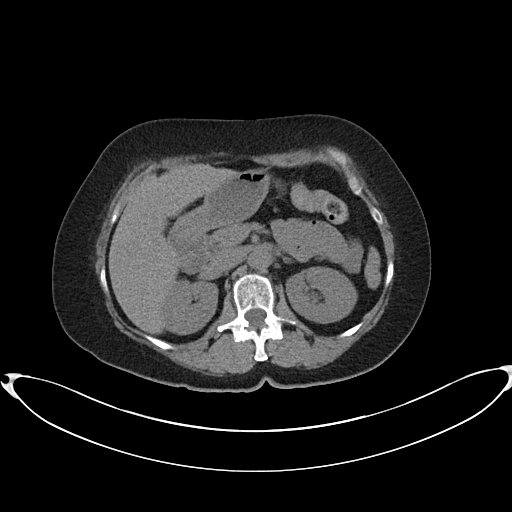
[im 64/81  soft-tissue]
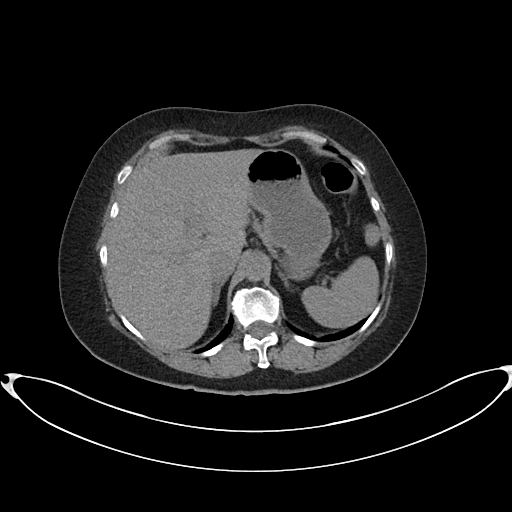
[im 71/81  soft-tissue]
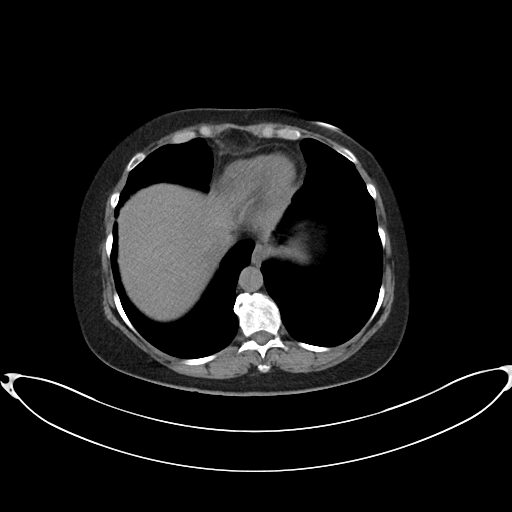
[im 77/81  soft-tissue]
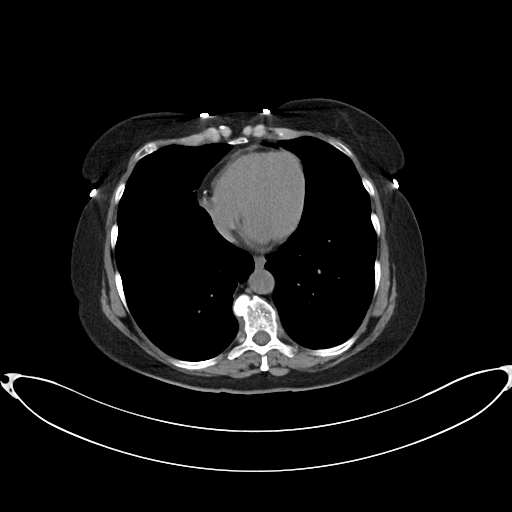

[Series 6: coronal · coronal · 0.75mm/px · 3 of 85 slices shown]
[im 29/85  soft-tissue]
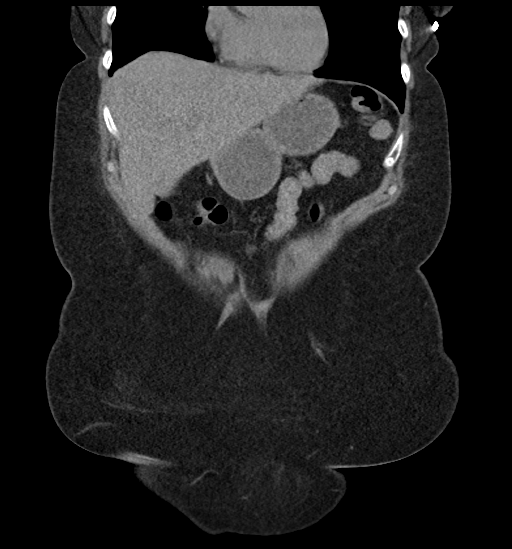
[im 38/85  soft-tissue]
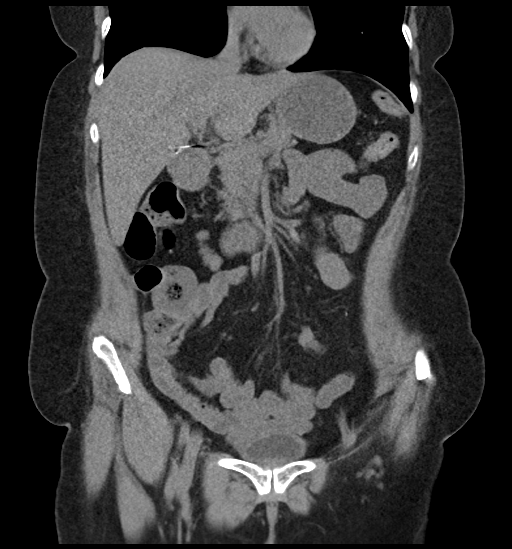
[im 47/85  soft-tissue]
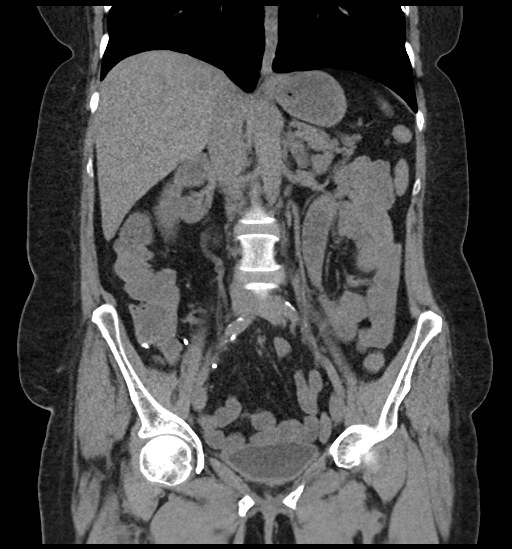

[16 of 46 positions shown; findings below may reference images not displayed]

FINDINGS: Lower chest: No acute abnormality.

Hepatobiliary: Limited without IV contrast. No large focal hepatic
abnormality. No intrahepatic biliary dilatation. Remote
cholecystectomy. Common bile duct nondilated.

Pancreas: Unremarkable. No pancreatic ductal dilatation or
surrounding inflammatory changes.

Spleen: Normal in size without focal abnormality.

Adrenals/Urinary Tract: Normal adrenal glands.

Left kidney and ureter demonstrate no acute process or obstruction.

Right kidney demonstrates slight pelviectasis and some degree of
associated proximal right hydroureter. As the right ureter courses
into the pelvis, it is difficult to separate from adjacent iliac
vasculature. The ureter does appear to course in close proximity of
a tiny right hemipelvis punctate calcification measuring 3 mm, image
58/3. This could represent a right distal ureteral calculus in the
pelvis but difficult to confirm because of poor separation from
adjacent iliac calcifications.

Urinary bladder unremarkable.

Stomach/Bowel: Negative for bowel obstruction, significant
dilatation, ileus, or free air. Previous appendectomy changes.

No free fluid, fluid collection, hemorrhage, hematoma, abscess or
ascites. Minimal distal colon diverticulosis. No acute inflammatory
process.

Vascular/Lymphatic: Aortoiliac atherosclerosis. Limited without IV
contrast. No aneurysm.

No bulky adenopathy.

Reproductive: Remote hysterectomy. No adnexal abnormality or pelvic
free fluid.

Other: No abdominal wall hernia or abnormality. No abdominopelvic
ascites.

Musculoskeletal: Degenerative changes of the thoracolumbar spine and
lower lumbar facet joints. No acute osseous finding.
IMPRESSION: Findings suspicious for a 3 mm right distal ureteral calculus in the
right hemipelvis with minimal proximal right hydroureter. See above
comment.

Remote cholecystectomy, appendectomy and hysterectomy.

Diverticulosis without acute inflammatory process

Aortic Atherosclerosis ([id number].D).

## 2022-01-16 ENCOUNTER — Ambulatory Visit
Admission: EM | Admit: 2022-01-16 | Discharge: 2022-01-16 | Disposition: A | Discharge status: Home / Self Care | Payer: Commercial Managed Care - HMO | Attending: Physician Assistant | Admitting: Physician Assistant

## 2022-01-16 DIAGNOSIS — K120 Recurrent oral aphthae: Secondary | ICD-10-CM | POA: Diagnosis not present

## 2022-01-16 MED ORDER — NYSTATIN 100000 UNIT/ML MT SUSP: 5.0000 mL | Freq: Four times a day (QID) | OROMUCOSAL | 0 refills | Status: DC | PRN

## 2022-01-16 NOTE — ED Triage Notes (Signed)
Pt presents with lesion under tongue that is progressing that is causing ear pain and headache over one month.

## 2022-01-18 NOTE — ED Provider Notes (Signed)
[facility]    CSN: [id number] Arrival date & time: 01/16/22  1425      History   Chief Complaint Chief Complaint  Patient presents with   Lesion under tongue    HPI Lori Watkins is a 61 y.o. female.   Pt complains of a lesion under her tongue that started about one month ago.  Reports painful eating and swallowing. She has been eating less due to pain.  She has tried salt water gargles with no improvement.  Pt is a smoker. Denies sore throat or dental pain.     Past Medical History:  Diagnosis Date   Allergy    Arthritis    Asthma    Diabetes (HCC)    borderline   GERD (gastroesophageal reflux disease)    Seasonal allergies     There are no problems to display for this patient.   Past Surgical History:  Procedure Laterality Date   APPENDECTOMY     CESAREAN SECTION     CHOLECYSTECTOMY     SPINE SURGERY     tumor removed   TOTAL ABDOMINAL HYSTERECTOMY      OB History   No obstetric history on file.      Home Medications    Prior to Admission medications   Medication Sig Start Date End Date Taking? Authorizing Provider  magic mouthwash (nystatin, lidocaine, diphenhydrAMINE, alum & mag hydroxide) suspension Swish and spit 5 mLs 4 (four) times daily as needed for mouth pain. 01/16/22  Yes Ward, Tylene Fantasia, PA-C  acetaminophen (TYLENOL) 325 MG tablet Take 650 mg by mouth every 6 (six) hours as needed for mild pain.    [provider]  cephALEXin (KEFLEX) 500 MG capsule Take 1 capsule (500 mg total) by mouth 4 (four) times daily. 01/20/21   Petrucelli, Samantha R, PA-C  diphenhydrAMINE (BENADRYL) 25 MG tablet Take 50 mg by mouth every 6 (six) hours as needed for allergies.    [provider]  naproxen (NAPROSYN) 500 MG tablet Take 1 tablet (500 mg total) by mouth 2 (two) times daily as needed for moderate pain. 01/20/21   Petrucelli, Samantha R, PA-C  ondansetron (ZOFRAN ODT) 4 MG disintegrating tablet Take 1 tablet (4 mg total) by  mouth every 8 (eight) hours as needed for nausea or vomiting. 01/20/21   Petrucelli, Samantha R, PA-C  phenazopyridine (PYRIDIUM) 200 MG tablet Take 1 tablet (200 mg total) by mouth 3 (three) times daily as needed for pain. 01/20/21   Petrucelli, Samantha R, PA-C  vitamin B-12 (CYANOCOBALAMIN) 500 MCG tablet Take 500 mcg by mouth daily.    [provider]    Family History Family History  Family history unknown: Yes    Social History Social History   Tobacco Use   Smoking status: Every Day    Packs/day: 0.25    Types: Cigarettes   Smokeless tobacco: Never  Vaping Use   Vaping Use: Never used  Substance Use Topics   Alcohol use: Yes    Comment: RARE   Drug use: No     Allergies   Morphine and related, Shellfish allergy, Vicodin [hydrocodone-acetaminophen], and Sulfa antibiotics   Review of Systems Review of Systems  Constitutional:  Negative for chills and fever.  HENT:  Negative for ear pain and sore throat.   Eyes:  Negative for pain and visual disturbance.  Respiratory:  Negative for cough and shortness of breath.   Cardiovascular:  Negative for chest pain and palpitations.  Gastrointestinal:  Negative  for abdominal pain and vomiting.  Genitourinary:  Negative for dysuria and hematuria.  Musculoskeletal:  Negative for arthralgias and back pain.  Skin:  Positive for wound. Negative for color change and rash.  Neurological:  Negative for seizures and syncope.  All other systems reviewed and are negative.    Physical Exam Triage Vital Signs ED Triage Vitals [01/16/22 1518]  Enc Vitals Group     BP 119/77     Pulse Rate 81     Resp 18     Temp 98.1 F (36.7 C)     Temp Source Oral     SpO2 97 %     Weight      Height      Head Circumference      Peak Flow      Pain Score 7     Pain Loc      Pain Edu?      Excl. in GC?    No data found.  Updated Vital Signs BP 119/77 (BP Location: Left Arm)   Pulse 81   Temp 98.1 F (36.7 C) (Oral)   Resp  18   SpO2 97%   Visual Acuity Right Eye Distance:   Left Eye Distance:   Bilateral Distance:    Right Eye Near:   Left Eye Near:    Bilateral Near:     Physical Exam Vitals and nursing note reviewed.  Constitutional:      General: She is not in acute distress.    Appearance: She is well-developed.  HENT:     Head: Normocephalic and atraumatic.     Mouth/Throat:     Mouth: Oral lesions (white 1cm x 1cm round ulcer to the bottom of right side of tongue) present.     Dentition: Abnormal dentition. Dental caries present.  Eyes:     Conjunctiva/sclera: Conjunctivae normal.  Cardiovascular:     Rate and Rhythm: Normal rate and regular rhythm.     Heart sounds: No murmur heard. Pulmonary:     Effort: Pulmonary effort is normal. No respiratory distress.     Breath sounds: Normal breath sounds.  Abdominal:     Palpations: Abdomen is soft.     Tenderness: There is no abdominal tenderness.  Musculoskeletal:        General: No swelling.     Cervical back: Neck supple.  Skin:    General: Skin is warm and dry.     Capillary Refill: Capillary refill takes less than 2 seconds.  Neurological:     Mental Status: She is alert.  Psychiatric:        Mood and Affect: Mood normal.      UC Treatments / Results  Labs (all labs ordered are listed, but only abnormal results are displayed) Labs Reviewed - No data to display  EKG   Radiology No results found.  Procedures Procedures (including critical care time)  Medications Ordered in UC Medications - No data to display  Initial Impression / Assessment and Plan / UC Course  I have reviewed the triage vital signs and the nursing notes.  Pertinent labs & imaging results that were available during my care of the patient were reviewed by me and considered in my medical decision making (see chart for details).     Large circular deep ulcer to the bottom of her tongue, looks consistent with aphthous ulcer.  Given poor dentition  and smoking history advised follow up with dentist for further evaluation.  She currently does  not have a PCP, resources provided.  Magic mouth was prescribed.  ED precautions given.  Final Clinical Impressions(s) / UC Diagnoses   Final diagnoses:  Aphthous ulcer     Discharge Instructions      Use mouthwash as indicated Follow up with dentist and/or primary care.    ED Prescriptions     Medication Sig Dispense Auth. Provider   magic mouthwash (nystatin, lidocaine, diphenhydrAMINE, alum & mag hydroxide) suspension Swish and spit 5 mLs 4 (four) times daily as needed for mouth pain. 180 mL Ward, Tylene Fantasia, PA-C      PDMP not reviewed this encounter.   Ward, Tylene Fantasia, PA-C 01/18/22 1359

## 2022-04-24 ENCOUNTER — Encounter: Payer: Self-pay | Admitting: Emergency Medicine

## 2022-04-24 ENCOUNTER — Ambulatory Visit
Admission: EM | Admit: 2022-04-24 | Discharge: 2022-04-24 | Disposition: A | Discharge status: Home / Self Care | Payer: Commercial Managed Care - HMO | Attending: Physician Assistant | Admitting: Physician Assistant

## 2022-04-24 DIAGNOSIS — N3000 Acute cystitis without hematuria: Secondary | ICD-10-CM | POA: Insufficient documentation

## 2022-04-24 LAB — POCT URINALYSIS DIP (MANUAL ENTRY)
Bilirubin, UA: NEGATIVE
Blood, UA: NEGATIVE
Glucose, UA: NEGATIVE mg/dL
Ketones, POC UA: NEGATIVE mg/dL
Nitrite, UA: NEGATIVE
Protein Ur, POC: NEGATIVE mg/dL
Spec Grav, UA: >=1.03 — AB (ref 1.010–1.025)
Urobilinogen, UA: 0.2 E.U./dL
pH, UA: 5.5 (ref 5.0–8.0)

## 2022-04-24 MED ORDER — NITROFURANTOIN MONOHYD MACRO 100 MG PO CAPS: 100.0000 mg | ORAL_CAPSULE | Freq: Two times a day (BID) | ORAL | 0 refills | Status: DC

## 2022-04-26 LAB — URINE CULTURE: Culture: 80000 — AB

## 2022-07-09 ENCOUNTER — Ambulatory Visit
Admission: EM | Admit: 2022-07-09 | Discharge: 2022-07-09 | Disposition: A | Discharge status: Home / Self Care | Payer: Commercial Managed Care - HMO | Attending: Internal Medicine | Admitting: Internal Medicine

## 2022-07-09 DIAGNOSIS — R112 Nausea with vomiting, unspecified: Secondary | ICD-10-CM | POA: Diagnosis not present

## 2022-07-09 DIAGNOSIS — A084 Viral intestinal infection, unspecified: Secondary | ICD-10-CM

## 2022-07-09 MED ORDER — ONDANSETRON HCL 4 MG/2ML IJ SOLN
4.0000 mg | Freq: Once | INTRAMUSCULAR | Status: AC
  Administered 2022-07-09: 4 mg via INTRAMUSCULAR

## 2022-07-09 MED ORDER — ONDANSETRON 4 MG PO TBDP: 4.0000 mg | ORAL_TABLET | Freq: Three times a day (TID) | ORAL | 0 refills | Status: DC | PRN

## 2022-07-09 NOTE — Discharge Instructions (Signed)
It appears that you have a viral illness that should run its course and self resolve with symptomatic treatment as we discussed.  I have prescribed a nausea medication to take as needed.  Please ensure adequate fluid hydration with water, Gatorade, or Pedialyte.  Eat a bland diet.  See attached instructions.  Please go straight to the ER if not able to keep any food or fluids down or if symptoms persist or worsen in the next 24 to 48 hours.

## 2022-07-09 NOTE — ED Provider Notes (Signed)
[facility]    CSN: [id number] Arrival date & time: 07/09/22  1442      History   Chief Complaint Chief Complaint  Patient presents with   Nausea    HPI Lori Watkins is a 61 y.o. female.   Patient presents with nausea, vomiting, fatigue.  Patient reports that she started "not feeling well" approximately 6 days ago.  Then, she developed nausea with vomiting 4 days ago.  Denies any associated diarrhea.  States that she has not had a bowel movement in a few days given that she has not able to eat or drink anything since vomiting started.  Denies any associated upper respiratory symptoms, cough, fever.  Denies any known sick contacts.  Patient reports upper abdominal pain that occurs when she belches but otherwise no abdominal discomfort.  Denies blood in stool or emesis.     Past Medical History:  Diagnosis Date   Allergy    Arthritis    Asthma    Diabetes (HCC)    borderline   GERD (gastroesophageal reflux disease)    Seasonal allergies     There are no problems to display for this patient.   Past Surgical History:  Procedure Laterality Date   APPENDECTOMY     CESAREAN SECTION     CHOLECYSTECTOMY     SPINE SURGERY     tumor removed   TOTAL ABDOMINAL HYSTERECTOMY      OB History   No obstetric history on file.      Home Medications    Prior to Admission medications   Medication Sig Start Date End Date Taking? Authorizing Provider  ondansetron (ZOFRAN-ODT) 4 MG disintegrating tablet Take 1 tablet (4 mg total) by mouth every 8 (eight) hours as needed for nausea or vomiting. 07/09/22  Yes Ervin Knack E, FNP  acetaminophen (TYLENOL) 325 MG tablet Take 650 mg by mouth every 6 (six) hours as needed for mild pain.    [provider]  cephALEXin (KEFLEX) 500 MG capsule Take 1 capsule (500 mg total) by mouth 4 (four) times daily. 01/20/21   Petrucelli, Samantha R, PA-C  diphenhydrAMINE (BENADRYL) 25 MG tablet Take 50 mg by mouth every 6  (six) hours as needed for allergies.    [provider]  magic mouthwash (nystatin, lidocaine, diphenhydrAMINE, alum & mag hydroxide) suspension Swish and spit 5 mLs 4 (four) times daily as needed for mouth pain. 01/16/22   Ward, Tylene Fantasia, PA-C  naproxen (NAPROSYN) 500 MG tablet Take 1 tablet (500 mg total) by mouth 2 (two) times daily as needed for moderate pain. 01/20/21   Petrucelli, Pleas Koch, PA-C  nitrofurantoin, macrocrystal-monohydrate, (MACROBID) 100 MG capsule Take 1 capsule (100 mg total) by mouth 2 (two) times daily. 04/24/22   Tomi Bamberger, PA-C  phenazopyridine (PYRIDIUM) 200 MG tablet Take 1 tablet (200 mg total) by mouth 3 (three) times daily as needed for pain. 01/20/21   Petrucelli, Samantha R, PA-C  vitamin B-12 (CYANOCOBALAMIN) 500 MCG tablet Take 500 mcg by mouth daily.    [provider]    Family History Family History  Family history unknown: Yes    Social History Social History   Tobacco Use   Smoking status: Every Day    Packs/day: 0.25    Types: Cigarettes   Smokeless tobacco: Never  Vaping Use   Vaping Use: Never used  Substance Use Topics   Alcohol use: Never    Comment: RARE   Drug use: No  Allergies   Morphine and related, Shellfish allergy, Ketorolac, Morphine and related, Sulfa antibiotics, Vicodin [hydrocodone-acetaminophen], and Sulfa antibiotics   Review of Systems Review of Systems Per HPI  Physical Exam Triage Vital Signs ED Triage Vitals  Enc Vitals Group     BP 07/09/22 1532 125/78     Pulse Rate 07/09/22 1532 67     Resp 07/09/22 1532 20     Temp 07/09/22 1532 98.1 F (36.7 C)     Temp Source 07/09/22 1532 Oral     SpO2 07/09/22 1532 97 %     Weight --      Height --      Head Circumference --      Peak Flow --      Pain Score 07/09/22 1548 3     Pain Loc --      Pain Edu? --      Excl. in GC? --    No data found.  Updated Vital Signs BP 125/78 (BP Location: Left Arm)   Pulse 67   Temp 98.1  F (36.7 C) (Oral)   Resp 20   SpO2 97%   Visual Acuity Right Eye Distance:   Left Eye Distance:   Bilateral Distance:    Right Eye Near:   Left Eye Near:    Bilateral Near:     Physical Exam Constitutional:      General: She is not in acute distress.    Appearance: Normal appearance. She is not toxic-appearing or diaphoretic.  HENT:     Head: Normocephalic and atraumatic.     Mouth/Throat:     Mouth: Mucous membranes are moist.     Pharynx: No posterior oropharyngeal erythema.  Eyes:     Extraocular Movements: Extraocular movements intact.     Conjunctiva/sclera: Conjunctivae normal.  Cardiovascular:     Rate and Rhythm: Normal rate and regular rhythm.     Pulses: Normal pulses.     Heart sounds: Normal heart sounds.  Pulmonary:     Effort: Pulmonary effort is normal. No respiratory distress.     Breath sounds: Normal breath sounds.  Abdominal:     General: Bowel sounds are normal. There is no distension.     Palpations: Abdomen is soft.     Tenderness: There is no abdominal tenderness.  Neurological:     General: No focal deficit present.     Mental Status: She is alert and oriented to person, place, and time. Mental status is at baseline.  Psychiatric:        Mood and Affect: Mood normal.        Behavior: Behavior normal.        Thought Content: Thought content normal.        Judgment: Judgment normal.      UC Treatments / Results  Labs (all labs ordered are listed, but only abnormal results are displayed) Labs Reviewed - No data to display  EKG   Radiology No results found.  Procedures Procedures (including critical care time)  Medications Ordered in UC Medications  ondansetron (ZOFRAN) injection 4 mg (4 mg Intramuscular Given 07/09/22 1615)    Initial Impression / Assessment and Plan / UC Course  I have reviewed the triage vital signs and the nursing notes.  Pertinent labs & imaging results that were available during my care of the patient  were reviewed by me and considered in my medical decision making (see chart for details).     Differential diagnoses include viral gastroenteritis  versus food related illness.  There are no signs of acute abdomen or dehydration on exam so do not think that emergent evaluation is necessary.  Will prescribe ondansetron for patient to take at home.  Also gave IM Zofran today in urgent care.  Discussed bland diet and increasing clear oral fluid intake to prevent dehydration.  Advised to go straight to the ER if symptoms persist or worsen despite current medication regimen.  Discussed return and ER precautions.  Patient verbalized understanding and was agreeable with plan. Final Clinical Impressions(s) / UC Diagnoses   Final diagnoses:  Viral gastroenteritis  Nausea and vomiting, unspecified vomiting type     Discharge Instructions      It appears that you have a viral illness that should run its course and self resolve with symptomatic treatment as we discussed.  I have prescribed a nausea medication to take as needed.  Please ensure adequate fluid hydration with water, Gatorade, or Pedialyte.  Eat a bland diet.  See attached instructions.  Please go straight to the ER if not able to keep any food or fluids down or if symptoms persist or worsen in the next 24 to 48 hours.    ED Prescriptions     Medication Sig Dispense Auth. Provider   ondansetron (ZOFRAN-ODT) 4 MG disintegrating tablet Take 1 tablet (4 mg total) by mouth every 8 (eight) hours as needed for nausea or vomiting. 20 tablet [place], Acie Fredrickson, [place]      PDMP not reviewed this encounter.   Gustavus Bryant, [place] 07/10/22 [phone]

## 2022-07-09 NOTE — ED Triage Notes (Signed)
Pt presents to uc with co of nausea, vomiting and fatigue for 4 days. Pt reports otc pepto and other medications and tylenol with minimal improvement

## 2023-01-09 ENCOUNTER — Emergency Department (HOSPITAL_COMMUNITY)
Admission: EM | Admit: 2023-01-09 | Discharge: 2023-01-09 | Disposition: A | Discharge status: Home / Self Care | Payer: Commercial Managed Care - HMO | Attending: Emergency Medicine | Admitting: Emergency Medicine

## 2023-01-09 ENCOUNTER — Encounter (HOSPITAL_COMMUNITY): Payer: Self-pay | Admitting: *Deleted

## 2023-01-09 ENCOUNTER — Other Ambulatory Visit: Payer: Self-pay

## 2023-01-09 ENCOUNTER — Emergency Department (HOSPITAL_COMMUNITY): Payer: Commercial Managed Care - HMO

## 2023-01-09 DIAGNOSIS — R1084 Generalized abdominal pain: Secondary | ICD-10-CM

## 2023-01-09 DIAGNOSIS — E119 Type 2 diabetes mellitus without complications: Secondary | ICD-10-CM | POA: Diagnosis not present

## 2023-01-09 DIAGNOSIS — R112 Nausea with vomiting, unspecified: Secondary | ICD-10-CM

## 2023-01-09 DIAGNOSIS — R109 Unspecified abdominal pain: Secondary | ICD-10-CM | POA: Diagnosis present

## 2023-01-09 DIAGNOSIS — J45909 Unspecified asthma, uncomplicated: Secondary | ICD-10-CM | POA: Diagnosis not present

## 2023-01-09 LAB — COMPREHENSIVE METABOLIC PANEL
ALT: 32 U/L (ref 0–44)
AST: 23 U/L (ref 15–41)
Albumin: 3.7 g/dL (ref 3.5–5.0)
Alkaline Phosphatase: 102 U/L (ref 38–126)
Anion gap: 12 (ref 5–15)
BUN: 12 mg/dL (ref 8–23)
CO2: 21 mmol/L — ABNORMAL LOW (ref 22–32)
Calcium: 8.7 mg/dL — ABNORMAL LOW (ref 8.9–10.3)
Chloride: 100 mmol/L (ref 98–111)
Creatinine, Ser: 0.75 mg/dL (ref 0.44–1.00)
GFR, Estimated: >60 mL/min (ref >60)
Glucose, Bld: 140 mg/dL — ABNORMAL HIGH (ref 70–99)
Potassium: 3 mmol/L — ABNORMAL LOW (ref 3.5–5.1)
Sodium: 133 mmol/L — ABNORMAL LOW (ref 135–145)
Total Bilirubin: 0.6 mg/dL (ref 0.3–1.2)
Total Protein: 6.6 g/dL (ref 6.5–8.1)

## 2023-01-09 LAB — CBC WITH DIFFERENTIAL/PLATELET
Abs Immature Granulocytes: 0.04 10*3/uL (ref 0.00–0.07)
Basophils Absolute: 0.1 10*3/uL (ref 0.0–0.1)
Basophils Relative: 0 %
Eosinophils Absolute: 0.1 10*3/uL (ref 0.0–0.5)
Eosinophils Relative: 1 %
HCT: 38.9 % (ref 36.0–46.0)
Hemoglobin: 13 g/dL (ref 12.0–15.0)
Immature Granulocytes: 0 %
Lymphocytes Relative: 25 %
Lymphs Abs: 2.8 10*3/uL (ref 0.7–4.0)
MCH: 29.3 pg (ref 26.0–34.0)
MCHC: 33.4 g/dL (ref 30.0–36.0)
MCV: 87.6 fL (ref 80.0–100.0)
Monocytes Absolute: 0.9 10*3/uL (ref 0.1–1.0)
Monocytes Relative: 8 %
Neutro Abs: 7.5 10*3/uL (ref 1.7–7.7)
Neutrophils Relative %: 66 %
Platelets: 285 10*3/uL (ref 150–400)
RBC: 4.44 MIL/uL (ref 3.87–5.11)
RDW: 12.3 % (ref 11.5–15.5)
WBC: 11.4 10*3/uL — ABNORMAL HIGH (ref 4.0–10.5)
nRBC: 0 % (ref 0.0–0.2)

## 2023-01-09 LAB — URINALYSIS, ROUTINE W REFLEX MICROSCOPIC
Bilirubin Urine: NEGATIVE
Glucose, UA: NEGATIVE mg/dL
Hgb urine dipstick: NEGATIVE
Ketones, ur: NEGATIVE mg/dL
Leukocytes,Ua: NEGATIVE
Nitrite: NEGATIVE
Protein, ur: NEGATIVE mg/dL
Specific Gravity, Urine: >1.046 — ABNORMAL HIGH (ref 1.005–1.030)
pH: 6 (ref 5.0–8.0)

## 2023-01-09 LAB — I-STAT CHEM 8, ED
BUN: 12 mg/dL (ref 8–23)
Calcium, Ion: 1.15 mmol/L (ref 1.15–1.40)
Chloride: 100 mmol/L (ref 98–111)
Creatinine, Ser: 0.7 mg/dL (ref 0.44–1.00)
Glucose, Bld: 142 mg/dL — ABNORMAL HIGH (ref 70–99)
HCT: 39 % (ref 36.0–46.0)
Hemoglobin: 13.3 g/dL (ref 12.0–15.0)
Potassium: 3.1 mmol/L — ABNORMAL LOW (ref 3.5–5.1)
Sodium: 134 mmol/L — ABNORMAL LOW (ref 135–145)
TCO2: 24 mmol/L (ref 22–32)

## 2023-01-09 LAB — LIPASE, BLOOD: Lipase: 25 U/L (ref 11–51)

## 2023-01-09 MED ORDER — FAMOTIDINE IN NACL 20-0.9 MG/50ML-% IV SOLN
20.0000 mg | Freq: Once | INTRAVENOUS | Status: AC
  Administered 2023-01-09: 20 mg via INTRAVENOUS
  Filled 2023-01-09: qty 50

## 2023-01-09 MED ORDER — ONDANSETRON HCL 4 MG/2ML IJ SOLN
4.0000 mg | Freq: Once | INTRAMUSCULAR | Status: AC
  Administered 2023-01-09: 4 mg via INTRAVENOUS
  Filled 2023-01-09: qty 2

## 2023-01-09 MED ORDER — PANTOPRAZOLE SODIUM 20 MG PO TBEC: 20.0000 mg | DELAYED_RELEASE_TABLET | Freq: Every day | ORAL | 0 refills | Status: DC

## 2023-01-09 MED ORDER — METOCLOPRAMIDE HCL 10 MG PO TABS: 10.0000 mg | ORAL_TABLET | Freq: Four times a day (QID) | ORAL | 0 refills | Status: DC

## 2023-01-09 MED ORDER — FAMOTIDINE 20 MG PO TABS: 20.0000 mg | ORAL_TABLET | Freq: Two times a day (BID) | ORAL | 0 refills | Status: DC

## 2023-01-09 MED ORDER — SODIUM CHLORIDE 0.9 % IV SOLN
1000.0000 mL | Freq: Once | INTRAVENOUS | Status: AC
  Administered 2023-01-09: 1000 mL via INTRAVENOUS

## 2023-01-09 MED ORDER — FENTANYL CITRATE PF 50 MCG/ML IJ SOSY
100.0000 ug | PREFILLED_SYRINGE | Freq: Once | INTRAMUSCULAR | Status: AC
  Administered 2023-01-09: 100 ug via INTRAVENOUS
  Filled 2023-01-09: qty 2

## 2023-01-09 MED ORDER — ALUM & MAG HYDROXIDE-SIMETH 200-200-20 MG/5ML PO SUSP
30.0000 mL | Freq: Once | ORAL | Status: AC
  Administered 2023-01-09: 30 mL via ORAL
  Filled 2023-01-09: qty 30

## 2023-01-09 MED ORDER — IOHEXOL 350 MG/ML SOLN
75.0000 mL | Freq: Once | INTRAVENOUS | Status: AC | PRN
  Administered 2023-01-09: 75 mL via INTRAVENOUS

## 2023-01-09 MED ORDER — LIDOCAINE VISCOUS HCL 2 % MT SOLN
15.0000 mL | Freq: Once | OROMUCOSAL | Status: AC
  Administered 2023-01-09: 15 mL via OROMUCOSAL
  Filled 2023-01-09: qty 15

## 2023-01-09 MED ORDER — PANTOPRAZOLE SODIUM 40 MG IV SOLR
40.0000 mg | Freq: Once | INTRAVENOUS | Status: AC
  Administered 2023-01-09: 40 mg via INTRAVENOUS
  Filled 2023-01-09: qty 10

## 2023-01-09 MED ORDER — METOCLOPRAMIDE HCL 5 MG/ML IJ SOLN
10.0000 mg | Freq: Once | INTRAMUSCULAR | Status: AC
  Administered 2023-01-09: 10 mg via INTRAVENOUS
  Filled 2023-01-09: qty 2

## 2023-01-09 MED ORDER — SODIUM CHLORIDE 0.9 % IV BOLUS
1000.0000 mL | Freq: Once | INTRAVENOUS | Status: AC
  Administered 2023-01-09: 1000 mL via INTRAVENOUS

## 2023-01-09 NOTE — ED Provider Notes (Signed)
[place] EMERGENCY DEPARTMENT AT [facility] Provider Note   CSN: [id number] Arrival date & time: 01/09/23  4098     History  Chief Complaint  Patient presents with   Abdominal Pain    Lori Watkins is a 62 y.o. female.  Patient presents to the emergency department complaining of 9 days of nausea, vomiting, abdominal pain.  Patient describes abdominal pain as being right-sided, dull and constant, 9 out of 10 in severity.  She denies any aggravating or alleviating factors.  She has previously had cholecystectomy and appendectomy.  Patient also had previous total abdominal hysterectomy.  She states she has been unable to keep anything down including fluids.  She states that she has tried water and that is caused severe pain on the right side.  She states her last bowel movement was approximately 3 days ago but believes this is due to lack of oral intake.  She denies passing gas recently.  Patient denies chest pain, shortness of breath, urinary symptoms.  Past medical history significant for type II DM, GERD, asthma, surgical history as noted above  HPI     Home Medications Prior to Admission medications   Medication Sig Start Date End Date Taking? Authorizing Provider  famotidine (PEPCID) 20 MG tablet Take 1 tablet (20 mg total) by mouth 2 (two) times daily. 01/09/23  Yes Darrick Grinder, PA-C  metoCLOPramide (REGLAN) 10 MG tablet Take 1 tablet (10 mg total) by mouth every 6 (six) hours. 01/09/23  Yes Darrick Grinder, PA-C  acetaminophen (TYLENOL) 325 MG tablet Take 650 mg by mouth every 6 (six) hours as needed for mild pain.    [provider]  cephALEXin (KEFLEX) 500 MG capsule Take 1 capsule (500 mg total) by mouth 4 (four) times daily. 01/20/21   Petrucelli, Samantha R, PA-C  diphenhydrAMINE (BENADRYL) 25 MG tablet Take 50 mg by mouth every 6 (six) hours as needed for allergies.    [provider]  magic mouthwash (nystatin, lidocaine, diphenhydrAMINE,  alum & mag hydroxide) suspension Swish and spit 5 mLs 4 (four) times daily as needed for mouth pain. 01/16/22   Ward, Tylene Fantasia, PA-C  naproxen (NAPROSYN) 500 MG tablet Take 1 tablet (500 mg total) by mouth 2 (two) times daily as needed for moderate pain. 01/20/21   Petrucelli, Pleas Koch, PA-C  nitrofurantoin, macrocrystal-monohydrate, (MACROBID) 100 MG capsule Take 1 capsule (100 mg total) by mouth 2 (two) times daily. 04/24/22   Tomi Bamberger, PA-C  ondansetron (ZOFRAN-ODT) 4 MG disintegrating tablet Take 1 tablet (4 mg total) by mouth every 8 (eight) hours as needed for nausea or vomiting. 07/09/22   Gustavus Bryant, FNP  phenazopyridine (PYRIDIUM) 200 MG tablet Take 1 tablet (200 mg total) by mouth 3 (three) times daily as needed for pain. 01/20/21   Petrucelli, Samantha R, PA-C  vitamin B-12 (CYANOCOBALAMIN) 500 MCG tablet Take 500 mcg by mouth daily.    [provider]      Allergies    Morphine and codeine, Shellfish allergy, Ketorolac, Morphine and codeine, Sulfa antibiotics, Vicodin [hydrocodone-acetaminophen], and Sulfa antibiotics    Review of Systems   Review of Systems  Physical Exam Updated Vital Signs BP 123/73 (BP Location: Right Arm)   Pulse (!) 54   Temp 98.3 F (36.8 C) (Oral)   Resp 20   Ht 5' 2.5" (1.588 m)   Wt 63.5 kg   SpO2 100%   BMI 25.20 kg/m  Physical Exam Vitals and nursing note  reviewed.  Constitutional:      General: She is not in acute distress.    Appearance: She is well-developed.  HENT:     Head: Normocephalic and atraumatic.  Eyes:     Conjunctiva/sclera: Conjunctivae normal.  Cardiovascular:     Rate and Rhythm: Normal rate and regular rhythm.     Heart sounds: No murmur heard. Pulmonary:     Effort: Pulmonary effort is normal. No respiratory distress.     Breath sounds: Normal breath sounds.  Abdominal:     Palpations: Abdomen is soft.     Tenderness: There is abdominal tenderness (Generalized abdominal pain, worse on right  side) in the right upper quadrant and right lower quadrant.  Musculoskeletal:        General: No swelling.     Cervical back: Neck supple.  Skin:    General: Skin is warm and dry.     Capillary Refill: Capillary refill takes less than 2 seconds.  Neurological:     Mental Status: She is alert.  Psychiatric:        Mood and Affect: Mood normal.     ED Results / Procedures / Treatments   Labs (all labs ordered are listed, but only abnormal results are displayed) Labs Reviewed  CBC WITH DIFFERENTIAL/PLATELET - Abnormal; Notable for the following components:      Result Value   WBC 11.4 (*)    All other components within normal limits  COMPREHENSIVE METABOLIC PANEL - Abnormal; Notable for the following components:   Sodium 133 (*)    Potassium 3.0 (*)    CO2 21 (*)    Glucose, Bld 140 (*)    Calcium 8.7 (*)    All other components within normal limits  I-STAT CHEM 8, ED - Abnormal; Notable for the following components:   Sodium 134 (*)    Potassium 3.1 (*)    Glucose, Bld 142 (*)    All other components within normal limits  LIPASE, BLOOD  URINALYSIS, ROUTINE W REFLEX MICROSCOPIC    EKG None  Radiology CT ABDOMEN PELVIS W CONTRAST  Result Date: 01/09/2023 CLINICAL DATA:  Bowel obstruction.  Abdominal pain EXAM: CT ABDOMEN AND PELVIS WITH CONTRAST TECHNIQUE: Multidetector CT imaging of the abdomen and pelvis was performed using the standard protocol following bolus administration of intravenous contrast. RADIATION DOSE REDUCTION: This exam was performed according to the departmental dose-optimization program which includes automated exposure control, adjustment of the mA and/or kV according to patient size and/or use of iterative reconstruction technique. CONTRAST:  75mL OMNIPAQUE IOHEXOL 350 MG/ML SOLN COMPARISON:  Noncontrast CT 01/20/2021 FINDINGS: Lower chest: There is some linear opacity lung bases likely scar or atelectasis. No pleural effusion. Slight elevation of the  right hemidiaphragm. Patulous lower esophagus with slight wall thickening and a small hiatal hernia. Coronary artery calcifications are seen. Hepatobiliary: Diffuse fatty liver infiltration. Previous cholecystectomy. Patent portal vein. Pancreas: Unremarkable. No pancreatic ductal dilatation or surrounding inflammatory changes. Spleen: Normal in size without focal abnormality. Adrenals/Urinary Tract: Adrenal glands are unremarkable. Kidneys are normal, without renal calculi, focal lesion, or hydronephrosis. Bladder is unremarkable. Stomach/Bowel: Stomach is within normal limits. Appendix is not seen. There are surgical clips in the right lower quadrant. Please correlate with clinical history. No evidence of bowel wall thickening, distention, or inflammatory changes. Scattered colonic diverticula. Vascular/Lymphatic: Aortic atherosclerosis. No enlarged abdominal or pelvic lymph nodes. Reproductive: Status post hysterectomy. No adnexal masses. Other: No abdominal wall hernia or abnormality. No abdominopelvic ascites. Musculoskeletal: Mild degenerative  changes along the spine and pelvis. IMPRESSION: No bowel obstruction, free air or free fluid. Scattered colonic diverticula. Fatty liver infiltration.  Previous cholecystectomy. Small hiatal hernia with slight wall thickening of the lower esophagus and some luminal fluid. Please correlate with any symptoms. Coronary artery calcifications are seen. Please correlate for other coronary risk factors Electronically Signed   By: Karen Kays M.D.   On: 01/09/2023 11:21    Procedures Procedures    Medications Ordered in ED Medications  fentaNYL (SUBLIMAZE) injection 100 mcg (100 mcg Intravenous Given 01/09/23 0917)  ondansetron (ZOFRAN) injection 4 mg (4 mg Intravenous Given 01/09/23 0917)  0.9 %  sodium chloride infusion ( Intravenous Infusion Verify 01/09/23 1455)  sodium chloride 0.9 % bolus 1,000 mL (0 mLs Intravenous Stopped 01/09/23 1120)  iohexol (OMNIPAQUE) 350  MG/ML injection 75 mL (75 mLs Intravenous Contrast Given 01/09/23 1047)  metoCLOPramide (REGLAN) injection 10 mg (10 mg Intravenous Given 01/09/23 1125)  famotidine (PEPCID) IVPB 20 mg premix (0 mg Intravenous Stopped 01/09/23 1454)  pantoprazole (PROTONIX) injection 40 mg (40 mg Intravenous Given 01/09/23 1423)  alum & mag hydroxide-simeth (MAALOX/MYLANTA) 200-200-20 MG/5ML suspension 30 mL (30 mLs Oral Given 01/09/23 1424)  lidocaine (XYLOCAINE) 2 % viscous mouth solution 15 mL (15 mLs Mouth/Throat Given 01/09/23 1424)    ED Course/ Medical Decision Making/ A&P                             Medical Decision Making Amount and/or Complexity of Data Reviewed Labs: ordered. Radiology: ordered.  Risk OTC drugs. Prescription drug management.   This patient presents to the ED for concern of abdominal pain, nausea, vomiting, this involves an extensive number of treatment options, and is a complaint that carries with it a high risk of complications and morbidity.  The differential diagnosis includes small bowel obstruction, mesenteric adenitis, colitis, gastritis, GERD, pancreatitis, others   Co morbidities that complicate the patient evaluation  Previous abdominal surgeries   Lab Tests:  I Ordered, and personally interpreted labs.  The pertinent results include: Potassium 3.0, lipase 25, WBC 11.4   Imaging Studies ordered:  I ordered imaging studies including CT abdomen pelvis with contrast I independently visualized and interpreted imaging which showed  No bowel obstruction, free air or free fluid. Scattered colonic  diverticula.    Fatty liver infiltration.  Previous cholecystectomy.    Small hiatal hernia with slight wall thickening of the lower  esophagus and some luminal fluid. Please correlate with any  symptoms.    Coronary artery calcifications are seen   I agree with the radiologist interpretation     Problem List / ED Course / Critical interventions / Medication  management   I ordered medication including fentanyl and Zofran, normal saline , Reglan, Pepcid, Protonix, GI cocktail Reevaluation of the patient after these medicines showed that the patient improved I have reviewed the patients home medicines and have made adjustments as needed    Test / Admission - Considered:  Patient passed a p.o. challenge after the Reglan.  She continues to complain of some pain in the right quadrant when swallowing liquids.  Question if this may be related to her GERD, possibly peptic ulcer disease.  No significant acute findings on CT abdomen pelvis.  I see no indication at this time for admission.  Vitals are stable.  Labs are grossly unremarkable.  Plan to discharge home with recommendations for course of H2 blocker, and follow-up with primary  care.  Patient voices understanding with plan.  Return precautions provided.         Final Clinical Impression(s) / ED Diagnoses Final diagnoses:  Generalized abdominal pain  Nausea and vomiting, unspecified vomiting type    Rx / DC Orders ED Discharge Orders          Ordered    famotidine (PEPCID) 20 MG tablet  2 times daily        01/09/23 1417    pantoprazole (PROTONIX) 20 MG tablet  Daily,   Status:  Discontinued        01/09/23 1417    metoCLOPramide (REGLAN) 10 MG tablet  Every 6 hours        01/09/23 1449              Pamala Duffel 01/09/23 1515    Ernie Avena, MD 01/09/23 2022

## 2023-01-09 NOTE — ED Triage Notes (Signed)
Cv/o abd. Pain x 9 days states it is dark colored states she wasn't able to keep and fluids down.,

## 2023-01-09 NOTE — ED Notes (Signed)
Pt is able to hold down the fluids, but stated it hurts when she drinks. PA made aware.

## 2023-01-09 NOTE — Discharge Instructions (Addendum)
You were evaluated today for nausea, vomiting, and abdominal pain. Your CT scan was reassuring. I have prescribed pepcid and reglan. Please take as prescribed. Please follow up with your PCP and gastroenterology for further evaluation and management. If you develop any life threatening symptoms please return to the emergency department

## 2023-01-10 ENCOUNTER — Ambulatory Visit: Payer: Commercial Managed Care - HMO | Admitting: Nurse Practitioner

## 2023-01-11 ENCOUNTER — Ambulatory Visit (INDEPENDENT_AMBULATORY_CARE_PROVIDER_SITE_OTHER): Payer: Commercial Managed Care - HMO | Admitting: Nurse Practitioner

## 2023-01-11 ENCOUNTER — Encounter: Payer: Self-pay | Admitting: Nurse Practitioner

## 2023-01-11 VITALS — BP 130/68 | HR 58 | Ht 62.0 in | Wt 153.0 lb

## 2023-01-11 DIAGNOSIS — R933 Abnormal findings on diagnostic imaging of other parts of digestive tract: Secondary | ICD-10-CM

## 2023-01-11 DIAGNOSIS — R1010 Upper abdominal pain, unspecified: Secondary | ICD-10-CM

## 2023-01-11 DIAGNOSIS — K219 Gastro-esophageal reflux disease without esophagitis: Secondary | ICD-10-CM | POA: Diagnosis not present

## 2023-01-11 MED ORDER — SUCRALFATE 1 G PO TABS: 1.0000 g | ORAL_TABLET | Freq: Three times a day (TID) | ORAL | 0 refills | Status: DC

## 2023-01-11 MED ORDER — FAMOTIDINE 20 MG PO TABS: 20.0000 mg | ORAL_TABLET | Freq: Every day | ORAL | 0 refills | Status: AC

## 2023-01-11 NOTE — Patient Instructions (Addendum)
_______________________________________________________  If your blood pressure at your visit was 140/90 or greater, please contact your primary care physician to follow up on this.  _______________________________________________________  If you are age 63 or older, your body mass index should be between 23-30. Your Body mass index is 27.98 kg/m. If this is out of the aforementioned range listed, please consider follow up with your Primary Care Provider.  If you are age 10 or younger, your body mass index should be between 19-25. Your Body mass index is 27.98 kg/m. If this is out of the aformentioned range listed, please consider follow up with your Primary Care Provider.  ________________________________________________________  The [facility] providers would like to encourage you to use [facility] to communicate with providers for non-urgent requests or questions.  Due to long hold times on the telephone, sending your provider a message by [facility] may be a faster and more efficient way to get a response.  Please allow 48 business hours for a response.  Please remember that this is for non-urgent requests.  _______________________________________________________  CONTINUE: Nexium daily  CHANGE: Pepcid to daily at bedtime  We have sent the following medications to your pharmacy for you to pick up at your convenience:  START: Carafate 1gm take 1 tablet three times daily with meals and at bedtime.  AVOID all NSAIDS to include Meloxicam, Ibuprofen, Aleve, Diclofenac, etc. You may take Tylenol instead.   You have been scheduled for an endoscopy. Please follow written instructions given to you at your visit today. If you use inhalers (even only as needed), please bring them with you on the day of your procedure.  Due to recent changes in healthcare laws, you may see the results of your imaging and laboratory studies on MyChart before your provider has had a chance to review them.  We understand  that in some cases there may be results that are confusing or concerning to you. Not all laboratory results come back in the same time frame and the provider may be waiting for multiple results in order to interpret others.  Please give [place] 48 hours in order for your provider to thoroughly review all the results before contacting the office for clarification of your results.   Thank you for entrusting me with your care and choosing [facility].  Gunnar Fusi, NP

## 2023-01-11 NOTE — Progress Notes (Signed)
Assessment / Plan   Primary GI: Marsa Aris, MD ( 2018)  62 yo female with several days of severe mid upper / RUQ abdominal pain exacerbated by PO intake and deep breaths. Had an associated episode of brown emesis ( resolved) but no melena. Rule out PUD in setting of NSAIDs.  LFTs, lipase, hgb all normal at recent ED visit. CTAP with contrast without acute findings.  -Carafate ac and HS x 2 weeks -Continue Nexium  q am 30 minutes prior to breakfast.  -Change famotidine (prescribed by ED) to Q HS -No NSAIDs for now -Schedule for EGD, can be done Friday. The risks and benefits of EGD with possible biopsies were discussed with the patient who agrees to proceed.  ? Constipation. Has decreased stool output but then hasn't been eating. CT scan two days ago without mention of large stool burden. Constipation unlikely cause of upper pain, N/V  Abnormal esophageal findings on CT scan showing patulous lower esophagus with slight wall thickening and a small hiatal hernia Further evaluation at time of EGD  Colon cancer screening.  Needs screening colonoscopy when able to tolerate bowel prep  History of Present Illness   Chief Complaint: upper abdominal pain   62 y.o. yo female with a past medical history consisting of, but not necessarily limited to GERD, asthma, diabetes, cholecystectomy, appendectomy, hysterectomy  Lori Watkins is here with an 11 day history of severe upper abdominal pain involving the mid upper abdomen and RUQ. Pain non-radiating, nearly constant and exacerbated by PO intake and deep breaths. Never had this pain before. She initially had some associated vomiting of dark material, now resolved.  No melena. She takes Aleve about 2x / week or less, no other NSAIDS. Hasn't been able to eat much so not having many BMs. Did have a small BM yesterday and it appeared to contained pink mucous   Previous Endoscopies / Labs /  Imaging   EGD Nov 2018 Normal esophagus. Small HH.  Non-bleeding erosive gastropathy, normal examined duodenum Surgical [P], gastric antrum and bulb erosion biopsy - MILD CHRONIC INACTIVE GASTRITIS. - THERE IS NO EVIDENCE OF HELICOBACTER PYLORI, DYSPLASIA, OR MALIGNANCY. - SEE COMMENT     Latest Ref Rng & Units 01/09/2023    9:39 AM 01/09/2023    8:56 AM 01/20/2021    9:29 AM  CBC  WBC 4.0 - 10.5 K/uL  11.4  9.1   Hemoglobin 12.0 - 15.0 g/dL 16.1  09.6  04.5   Hematocrit 36.0 - 46.0 % 39.0  38.9  46.0   Platelets 150 - 400 K/uL  285  376     Lab Results  Component Value Date   LIPASE 25 01/09/2023      Latest Ref Rng & Units 01/09/2023    9:39 AM 01/09/2023    8:56 AM 01/20/2021    9:34 AM  CMP  Glucose 70 - 99 mg/dL 409  811  914   BUN 8 - 23 mg/dL 12  12  14    Creatinine 0.44 - 1.00 mg/dL 7.82  9.56  2.13   Sodium 135 - 145 mmol/L 134  133  131   Potassium 3.5 - 5.1 mmol/L 3.1  3.0  3.7   Chloride 98 - 111 mmol/L 100  100  100   CO2 22 - 32 mmol/L  21  21   Calcium 8.9 - 10.3 mg/dL  8.7  9.8   Total Protein 6.5 - 8.1 g/dL  6.6  7.3  Total Bilirubin 0.3 - 1.2 mg/dL  0.6  1.0   Alkaline Phos 38 - 126 U/L  102  88   AST 15 - 41 U/L  23  21   ALT 0 - 44 U/L  32  21         CT ABDOMEN PELVIS W CONTRAST CLINICAL DATA:  Bowel obstruction.  Abdominal pain  EXAM: CT ABDOMEN AND PELVIS WITH CONTRAST  TECHNIQUE: Multidetector CT imaging of the abdomen and pelvis was performed using the standard protocol following bolus administration of intravenous contrast.  RADIATION DOSE REDUCTION: This exam was performed according to the departmental dose-optimization program which includes automated exposure control, adjustment of the mA and/or kV according to patient size and/or use of iterative reconstruction technique.  CONTRAST:  75mL OMNIPAQUE IOHEXOL 350 MG/ML SOLN  COMPARISON:  Noncontrast CT 01/20/2021  FINDINGS: Lower chest: There is some linear opacity lung bases likely scar or atelectasis. No pleural effusion.  Slight elevation of the right hemidiaphragm. Patulous lower esophagus with slight wall thickening and a small hiatal hernia. Coronary artery calcifications are seen.  Hepatobiliary: Diffuse fatty liver infiltration. Previous cholecystectomy. Patent portal vein.  Pancreas: Unremarkable. No pancreatic ductal dilatation or surrounding inflammatory changes.  Spleen: Normal in size without focal abnormality.  Adrenals/Urinary Tract: Adrenal glands are unremarkable. Kidneys are normal, without renal calculi, focal lesion, or hydronephrosis. Bladder is unremarkable.  Stomach/Bowel: Stomach is within normal limits. Appendix is not seen. There are surgical clips in the right lower quadrant. Please correlate with clinical history. No evidence of bowel wall thickening, distention, or inflammatory changes. Scattered colonic diverticula.  Vascular/Lymphatic: Aortic atherosclerosis. No enlarged abdominal or pelvic lymph nodes.  Reproductive: Status post hysterectomy. No adnexal masses.  Other: No abdominal wall hernia or abnormality. No abdominopelvic ascites.  Musculoskeletal: Mild degenerative changes along the spine and pelvis.  IMPRESSION: No bowel obstruction, free air or free fluid. Scattered colonic diverticula.  Fatty liver infiltration.  Previous cholecystectomy.  Small hiatal hernia with slight wall thickening of the lower esophagus and some luminal fluid. Please correlate with any symptoms.  Coronary artery calcifications are seen. Please correlate for other coronary risk factors  Electronically Signed   By: Karen Kays M.D.   On: 01/09/2023 11:21    Past Medical History:  Diagnosis Date   Allergy    Arthritis    Asthma    Diabetes (HCC)    borderline   GERD (gastroesophageal reflux disease)    Seasonal allergies    Past Surgical History:  Procedure Laterality Date   APPENDECTOMY     CESAREAN SECTION     CHOLECYSTECTOMY     SPINE SURGERY     tumor  removed   TOTAL ABDOMINAL HYSTERECTOMY     Family History  Family history unknown: Yes   Social History   Tobacco Use   Smoking status: Every Day    Packs/day: .25    Types: Cigarettes   Smokeless tobacco: Never  Vaping Use   Vaping Use: Never used  Substance Use Topics   Alcohol use: Never    Comment: RARE   Drug use: No   Current Outpatient Medications  Medication Sig Dispense Refill   acetaminophen (TYLENOL) 325 MG tablet Take 650 mg by mouth every 6 (six) hours as needed for mild pain.     esomeprazole (NEXIUM) 20 MG capsule Take 20 mg by mouth daily at 12 noon. OTC     sucralfate (CARAFATE) 1 g tablet Take 1 tablet (1 g total) by  mouth 4 (four) times daily -  with meals and at bedtime for 14 days. 56 tablet 0   famotidine (PEPCID) 20 MG tablet Take 1 tablet (20 mg total) by mouth at bedtime. 30 tablet 0   No current facility-administered medications for this visit.   Allergies  Allergen Reactions   Morphine And Codeine Anaphylaxis   Shellfish Allergy Anaphylaxis   Ketorolac    Morphine And Codeine    Sulfa Antibiotics    Vicodin [Hydrocodone-Acetaminophen] Itching   Sulfa Antibiotics Rash     Review of Systems: All systems reviewed and negative except where noted in HPI.   Wt Readings from Last 3 Encounters:  01/11/23 153 lb (69.4 kg)  01/09/23 140 lb (63.5 kg)  01/31/18 144 lb (65.3 kg)    Physical Exam:  BP 130/68   Pulse (!) 58   Ht 5\' 2"  (1.575 m)   Wt 153 lb (69.4 kg)   BMI 27.98 kg/m  Constitutional:  Pleasant, generally well appearing female in no acute distress. Psychiatric:  Normal mood and affect. Behavior is normal. EENT: Pupils normal.  Conjunctivae are normal. No scleral icterus. Neck supple.  Cardiovascular: Normal rate, regular rhythm.  Pulmonary/chest: Effort normal and breath sounds normal. No wheezing, rales or rhonchi. Abdominal: Soft, nondistended, nontender. Bowel sounds active throughout. There are no masses palpable. No  hepatomegaly. Neurological: Alert and oriented to person place and time. Skin: Skin is warm and dry. No rashes noted.  Willette Cluster, NP  01/11/2023, 10:07 AM

## 2023-01-13 ENCOUNTER — Ambulatory Visit (AMBULATORY_SURGERY_CENTER): Payer: Commercial Managed Care - HMO | Admitting: Gastroenterology

## 2023-01-13 ENCOUNTER — Encounter: Payer: Self-pay | Admitting: Gastroenterology

## 2023-01-13 VITALS — BP 108/62 | HR 55 | Temp 96.6°F | Resp 12 | Ht 62.0 in | Wt 153.0 lb

## 2023-01-13 DIAGNOSIS — B3781 Candidal esophagitis: Secondary | ICD-10-CM

## 2023-01-13 DIAGNOSIS — R1010 Upper abdominal pain, unspecified: Secondary | ICD-10-CM

## 2023-01-13 DIAGNOSIS — K2100 Gastro-esophageal reflux disease with esophagitis, without bleeding: Secondary | ICD-10-CM

## 2023-01-13 DIAGNOSIS — R933 Abnormal findings on diagnostic imaging of other parts of digestive tract: Secondary | ICD-10-CM

## 2023-01-13 DIAGNOSIS — K219 Gastro-esophageal reflux disease without esophagitis: Secondary | ICD-10-CM

## 2023-01-13 MED ORDER — PANTOPRAZOLE SODIUM 40 MG PO TBEC: 40.0000 mg | DELAYED_RELEASE_TABLET | Freq: Two times a day (BID) | ORAL | 3 refills | Status: AC

## 2023-01-13 MED ORDER — SUCRALFATE 1 G PO TABS: 1.0000 g | ORAL_TABLET | Freq: Four times a day (QID) | ORAL | 0 refills | Status: AC

## 2023-01-13 MED ORDER — FLUCONAZOLE 100 MG PO TABS: 100.0000 mg | ORAL_TABLET | Freq: Every day | ORAL | 0 refills | Status: AC

## 2023-01-13 MED ORDER — SODIUM CHLORIDE 0.9 % IV SOLN
500.0000 mL | Freq: Once | INTRAVENOUS | Status: DC
Start: 2023-01-13 — End: 2023-01-13

## 2023-01-13 NOTE — Progress Notes (Signed)
Reviewed and agree with documentation and assessment and plan. K. Veena Lashara Urey , MD   

## 2023-01-13 NOTE — Progress Notes (Signed)
Pt's states no medical or surgical changes since previsit or office visit. 

## 2023-01-13 NOTE — Progress Notes (Signed)
Called to room to assist during endoscopic procedure.  Patient ID and intended procedure confirmed with present staff. Received instructions for my participation in the procedure from the performing physician.  

## 2023-01-13 NOTE — Patient Instructions (Signed)
Resume previous diet and medications Follow an antireflux regimen Use Protonix 40 mg twice daily Use Sucralfate 1 gram 4 X daily for 2 weeks Diflucan 100 mg daily x 4 weeks  YOU HAD AN ENDOSCOPIC PROCEDURE TODAY AT THE [facility]:   Refer to the procedure report that was given to you for any specific questions about what was found during the examination.  If the procedure report does not answer your questions, please call your gastroenterologist to clarify.  If you requested that your care partner not be given the details of your procedure findings, then the procedure report has been included in a sealed envelope for you to review at your convenience later.  YOU SHOULD EXPECT: Some feelings of bloating in the abdomen. Passage of more gas than usual.  Walking can help get rid of the air that was put into your GI tract during the procedure and reduce the bloating. If you had a lower endoscopy (such as a colonoscopy or flexible sigmoidoscopy) you may notice spotting of blood in your stool or on the toilet paper. If you underwent a bowel prep for your procedure, you may not have a normal bowel movement for a few days.  Please Note:  You might notice some irritation and congestion in your nose or some drainage.  This is from the oxygen used during your procedure.  There is no need for concern and it should clear up in a day or so.  SYMPTOMS TO REPORT IMMEDIATELY:  Following upper endoscopy (EGD)  Vomiting of blood or coffee ground material  New chest pain or pain under the shoulder blades  Painful or persistently difficult swallowing  New shortness of breath  Fever of 100F or higher  Black, tarry-looking stools  For urgent or emergent issues, a gastroenterologist can be reached at any hour by calling [phone]. Do not use MyChart messaging for urgent concerns.    DIET:  We do recommend a small meal at first, but then you may proceed to your regular diet.  Drink plenty of  fluids but you should avoid alcoholic beverages for 24 hours.  ACTIVITY:  You should plan to take it easy for the rest of today and you should NOT DRIVE or use heavy machinery until tomorrow (because of the sedation medicines used during the test).    FOLLOW UP: Our staff will call the number listed on your records the next business day following your procedure.  We will call around 7:15- 8:00 am to check on you and address any questions or concerns that you may have regarding the information given to you following your procedure. If we do not reach you, we will leave a message.     If any biopsies were taken you will be contacted by phone or by letter within the next 1-3 weeks.  Please call us at [phone] if you have not heard about the biopsies in 3 weeks.    SIGNATURES/CONFIDENTIALITY: You and/or your care partner have signed paperwork which will be entered into your electronic medical record.  These signatures attest to the fact that that the information above on your After Visit Summary has been reviewed and is understood.  Full responsibility of the confidentiality of this discharge information lies with you and/or your care-partner.

## 2023-01-13 NOTE — Progress Notes (Signed)
To pacu, VSS. Report to [email or web address] 

## 2023-01-13 NOTE — Progress Notes (Signed)
Please refer to office visit note 01/11/23 by Willette Cluster. No additional changes in H&P Patient is appropriate for planned procedure(s) and anesthesia in an ambulatory setting  K. Scherry Ran , MD [phone]

## 2023-01-13 NOTE — Op Note (Signed)
[facility] Patient Name: Lori Watkins Procedure Date: 01/13/2023 11:01 AM MRN: [id number] Endoscopist: Napoleon Form , MD, [id number] Age: 62 Referring MD:  Date of Birth: Mar 04, 1961 Gender: Female Account #: [id number] Procedure:                Upper GI endoscopy Indications:              Epigastric abdominal pain, Abdominal pain in the                            right upper quadrant, Esophageal reflux symptoms                            that persist despite appropriate therapy, Nausea                            with vomiting Medicines:                Monitored Anesthesia Care Procedure:                Pre-Anesthesia Assessment:                           - Prior to the procedure, a History and Physical                            was performed, and patient medications and                            allergies were reviewed. The patient's tolerance of                            previous anesthesia was also reviewed. The risks                            and benefits of the procedure and the sedation                            options and risks were discussed with the patient.                            All questions were answered, and informed consent                            was obtained. Prior Anticoagulants: The patient has                            taken no anticoagulant or antiplatelet agents. ASA                            Grade Assessment: II - A patient with mild systemic                            disease. After reviewing the risks and benefits,  the patient was deemed in satisfactory condition to                            undergo the procedure.                           After obtaining informed consent, the endoscope was                            passed under direct vision. Throughout the                            procedure, the patient's blood pressure, pulse, and                            oxygen saturations were monitored  continuously. The                            Olympus scope [id number] was introduced through the                            mouth, and advanced to the second part of duodenum.                            The upper GI endoscopy was accomplished without                            difficulty. The patient tolerated the procedure                            well. Scope In: Scope Out: Findings:                 LA Grade D (one or more mucosal breaks involving at                            least 75% of esophageal circumference) esophagitis                            with no bleeding was found 25 to 36 cm from the                            incisors. Biopsies were taken with a cold forceps                            for Helicobacter pylori testing.                           A 4 cm hiatal hernia was present.                           The exam of the stomach was otherwise normal.                           The cardia  and gastric fundus were normal on                            retroflexion.                           The examined duodenum was normal. Complications:            No immediate complications. Estimated Blood Loss:     Estimated blood loss was minimal. Impression:               - LA Grade D reflux, candidiasis and erosive                            esophagitis with no bleeding. Rule out Barrett's                            esophagus. Biopsied.                           - 4 cm hiatal hernia.                           - Normal examined duodenum. Recommendation:           - Patient has a contact number available for                            emergencies. The signs and symptoms of potential                            delayed complications were discussed with the                            patient. Return to normal activities tomorrow.                            Written discharge instructions were provided to the                            patient.                           - Resume previous  diet.                           - Continue present medications.                           - Await pathology results.                           - Follow an antireflux regimen.                           - Use Protonix (pantoprazole) 40 mg PO BID. Rx for  90 days with 3 refills                           - Use sucralfate suspension 1 gram PO QID for 2                            weeks.                           - Diflucan 100mg  daily X 4 weeks Napoleon Form, MD 01/13/2023 11:31:26 AM This report has been signed electronically.

## 2023-01-17 ENCOUNTER — Telehealth: Payer: Self-pay | Admitting: *Deleted

## 2023-01-17 NOTE — Telephone Encounter (Signed)
  Follow up Call-     01/13/2023   10:28 AM  Call back number  Post procedure Call Back phone  # [phone]  Permission to leave phone message Yes     Patient questions:   Message left to call us if necessary.

## 2023-01-30 ENCOUNTER — Encounter: Payer: Self-pay | Admitting: Gastroenterology

## 2023-08-06 ENCOUNTER — Ambulatory Visit
Admission: EM | Admit: 2023-08-06 | Discharge: 2023-08-06 | Disposition: A | Discharge status: Home / Self Care | Payer: Commercial Managed Care - HMO | Attending: Physician Assistant | Admitting: Physician Assistant

## 2023-08-06 ENCOUNTER — Other Ambulatory Visit: Payer: Self-pay

## 2023-08-06 ENCOUNTER — Emergency Department (HOSPITAL_COMMUNITY): Payer: Commercial Managed Care - HMO

## 2023-08-06 ENCOUNTER — Encounter (HOSPITAL_COMMUNITY): Payer: Self-pay | Admitting: Emergency Medicine

## 2023-08-06 ENCOUNTER — Encounter: Payer: Self-pay | Admitting: *Deleted

## 2023-08-06 ENCOUNTER — Emergency Department (HOSPITAL_COMMUNITY)
Admission: EM | Admit: 2023-08-06 | Discharge: 2023-08-06 | Disposition: A | Discharge status: Home / Self Care | Payer: Commercial Managed Care - HMO | Attending: Emergency Medicine | Admitting: Emergency Medicine

## 2023-08-06 DIAGNOSIS — J101 Influenza due to other identified influenza virus with other respiratory manifestations: Secondary | ICD-10-CM | POA: Insufficient documentation

## 2023-08-06 DIAGNOSIS — K5900 Constipation, unspecified: Secondary | ICD-10-CM | POA: Diagnosis not present

## 2023-08-06 DIAGNOSIS — Z20822 Contact with and (suspected) exposure to covid-19: Secondary | ICD-10-CM | POA: Diagnosis not present

## 2023-08-06 DIAGNOSIS — R109 Unspecified abdominal pain: Secondary | ICD-10-CM

## 2023-08-06 DIAGNOSIS — R112 Nausea with vomiting, unspecified: Secondary | ICD-10-CM

## 2023-08-06 DIAGNOSIS — E119 Type 2 diabetes mellitus without complications: Secondary | ICD-10-CM | POA: Diagnosis not present

## 2023-08-06 DIAGNOSIS — R1084 Generalized abdominal pain: Secondary | ICD-10-CM | POA: Diagnosis not present

## 2023-08-06 DIAGNOSIS — M791 Myalgia, unspecified site: Secondary | ICD-10-CM | POA: Diagnosis present

## 2023-08-06 LAB — URINALYSIS, ROUTINE W REFLEX MICROSCOPIC
Bilirubin Urine: NEGATIVE
Glucose, UA: NEGATIVE mg/dL
Hgb urine dipstick: NEGATIVE
Ketones, ur: 5 mg/dL — AB
Leukocytes,Ua: NEGATIVE
Nitrite: NEGATIVE
Protein, ur: 100 mg/dL — AB
Specific Gravity, Urine: 1.028 (ref 1.005–1.030)
pH: 5 (ref 5.0–8.0)

## 2023-08-06 LAB — COMPREHENSIVE METABOLIC PANEL
ALT: 25 U/L (ref 0–44)
AST: 22 U/L (ref 15–41)
Albumin: 4 g/dL (ref 3.5–5.0)
Alkaline Phosphatase: 90 U/L (ref 38–126)
Anion gap: 13 (ref 5–15)
BUN: 27 mg/dL — ABNORMAL HIGH (ref 8–23)
CO2: 26 mmol/L (ref 22–32)
Calcium: 9 mg/dL (ref 8.9–10.3)
Chloride: 93 mmol/L — ABNORMAL LOW (ref 98–111)
Creatinine, Ser: 0.8 mg/dL (ref 0.44–1.00)
GFR, Estimated: >60 mL/min (ref >60)
Glucose, Bld: 174 mg/dL — ABNORMAL HIGH (ref 70–99)
Potassium: 3 mmol/L — ABNORMAL LOW (ref 3.5–5.1)
Sodium: 132 mmol/L — ABNORMAL LOW (ref 135–145)
Total Bilirubin: 0.7 mg/dL (ref 0.0–1.2)
Total Protein: 7.9 g/dL (ref 6.5–8.1)

## 2023-08-06 LAB — RESP PANEL BY RT-PCR (RSV, FLU A&B, COVID)  RVPGX2
Influenza A by PCR: POSITIVE — AB
Influenza B by PCR: NEGATIVE
Resp Syncytial Virus by PCR: NEGATIVE
SARS Coronavirus 2 by RT PCR: NEGATIVE

## 2023-08-06 LAB — LIPASE, BLOOD: Lipase: 28 U/L (ref 11–51)

## 2023-08-06 LAB — CBC
HCT: 43.2 % (ref 36.0–46.0)
Hemoglobin: 14.8 g/dL (ref 12.0–15.0)
MCH: 31 pg (ref 26.0–34.0)
MCHC: 34.3 g/dL (ref 30.0–36.0)
MCV: 90.4 fL (ref 80.0–100.0)
Platelets: 254 10*3/uL (ref 150–400)
RBC: 4.78 MIL/uL (ref 3.87–5.11)
RDW: 12.4 % (ref 11.5–15.5)
WBC: 13.8 10*3/uL — ABNORMAL HIGH (ref 4.0–10.5)
nRBC: 0 % (ref 0.0–0.2)

## 2023-08-06 LAB — MAGNESIUM: Magnesium: 2.5 mg/dL — ABNORMAL HIGH (ref 1.7–2.4)

## 2023-08-06 MED ORDER — SODIUM CHLORIDE 0.9 % IV SOLN
12.5000 mg | Freq: Once | INTRAVENOUS | Status: AC
  Administered 2023-08-06: 12.5 mg via INTRAVENOUS
  Filled 2023-08-06: qty 12.5

## 2023-08-06 MED ORDER — ONDANSETRON 4 MG PO TBDP: 4.0000 mg | ORAL_TABLET | Freq: Three times a day (TID) | ORAL | 0 refills | Status: AC | PRN

## 2023-08-06 MED ORDER — POTASSIUM CHLORIDE CRYS ER 20 MEQ PO TBCR: 20.0000 meq | EXTENDED_RELEASE_TABLET | Freq: Two times a day (BID) | ORAL | 0 refills | Status: AC

## 2023-08-06 MED ORDER — PROMETHAZINE HCL 25 MG RE SUPP: 25.0000 mg | Freq: Four times a day (QID) | RECTAL | 0 refills | Status: AC | PRN

## 2023-08-06 MED ORDER — ONDANSETRON 4 MG PO TBDP
4.0000 mg | ORAL_TABLET | Freq: Once | ORAL | Status: AC
  Administered 2023-08-06: 4 mg via ORAL

## 2023-08-06 MED ORDER — ACETAMINOPHEN 500 MG PO TABS
1000.0000 mg | ORAL_TABLET | Freq: Once | ORAL | Status: AC
  Administered 2023-08-06: 1000 mg via ORAL
  Filled 2023-08-06: qty 2

## 2023-08-06 MED ORDER — POTASSIUM CHLORIDE 10 MEQ/100ML IV SOLN
10.0000 meq | INTRAVENOUS | Status: AC
  Administered 2023-08-06 (×2): 10 meq via INTRAVENOUS
  Filled 2023-08-06 (×2): qty 100

## 2023-08-06 MED ORDER — SODIUM CHLORIDE 0.9 % IV BOLUS
1000.0000 mL | Freq: Once | INTRAVENOUS | Status: AC
  Administered 2023-08-06: 1000 mL via INTRAVENOUS

## 2023-08-06 NOTE — Discharge Instructions (Signed)
 Recommend follow up in the emergency department for further evaluation

## 2023-08-06 NOTE — ED Notes (Signed)
 Patient is being discharged from the Urgent Care and sent to the Emergency Department via pov . Per Harlene Sink, PA-C, patient is in need of higher level of care due to abdominal pain and vomiting x 3 days. Patient is aware and verbalizes understanding of plan of care.  Vitals:   08/06/23 1435  BP: 110/76  Pulse: 84  Resp: 18  Temp: 98.1 F (36.7 C)  SpO2: 95%

## 2023-08-06 NOTE — ED Provider Notes (Signed)
 [place] EMERGENCY DEPARTMENT AT [facility] Provider Note   CSN: [id number] Arrival date & time: 08/06/23  1544     History  Chief Complaint  Patient presents with   Abdominal Pain    Lori Watkins is a 63 y.o. female, history of GERD, diabetes, who presents to the ED secondary to chills, body aches, diffuse abdominal pain, and nausea, vomiting is been going on for the last week.  She states last Monday, she started getting some congestion, and severe body aches.  She states this progressed, and she has had intractable nausea, vomiting, and diffuse abdominal pain, for the past week or so.  She went to urgent care and was given Zofran , without any relief of her symptoms.  She states she cannot eat or drink or work, because she feels so bad.  Denies any chest pain, shortness of breath, or fevers.    Home Medications Prior to Admission medications   Medication Sig Start Date End Date Taking? Authorizing Provider  ondansetron  (ZOFRAN -ODT) 4 MG disintegrating tablet Take 1 tablet (4 mg total) by mouth every 8 (eight) hours as needed. 08/06/23  Yes Bexley Mclester L, PA  potassium chloride  SA (KLOR-CON  M) 20 MEQ tablet Take 1 tablet (20 mEq total) by mouth 2 (two) times daily. 08/06/23  Yes Anastasiya Gowin L, PA  promethazine  (PHENERGAN ) 25 MG suppository Place 1 suppository (25 mg total) rectally every 6 (six) hours as needed for nausea or vomiting. 08/06/23  Yes Rayden Scheper L, PA  acetaminophen  (TYLENOL ) 325 MG tablet Take 650 mg by mouth every 6 (six) hours as needed for mild pain.    [provider]  famotidine  (PEPCID ) 20 MG tablet Take 1 tablet (20 mg total) by mouth at bedtime. Patient not taking: Reported on 08/06/2023 01/11/23   Kerman Vina HERO, NP  fluconazole  (DIFLUCAN ) 100 MG tablet Take 1 tablet (100 mg total) by mouth daily. Patient not taking: Reported on 08/06/2023 01/13/23   Nandigam, Kavitha V, MD  pantoprazole  (PROTONIX ) 40 MG tablet Take 1 tablet (40 mg  total) by mouth 2 (two) times daily. Patient not taking: Reported on 08/06/2023 01/13/23   Nandigam, Kavitha V, MD  sucralfate  (CARAFATE ) 1 g tablet Take 1 tablet (1 g total) by mouth 4 (four) times daily for 14 days. Patient not taking: Reported on 08/06/2023 01/13/23 01/27/23  Nandigam, Kavitha V, MD      Allergies    Morphine and codeine, Shellfish allergy, Ketorolac , Morphine and codeine, Sulfa antibiotics, Vicodin [hydrocodone -acetaminophen ], and Sulfa antibiotics    Review of Systems   Review of Systems  Gastrointestinal:  Positive for abdominal pain.    Physical Exam Updated Vital Signs BP 139/75 (BP Location: Left Arm)   Pulse 70   Temp 98.4 F (36.9 C) (Oral)   Resp 20   SpO2 96%  Physical Exam Vitals and nursing note reviewed.  Constitutional:      General: She is not in acute distress.    Appearance: She is well-developed.  HENT:     Head: Normocephalic and atraumatic.  Eyes:     Conjunctiva/sclera: Conjunctivae normal.  Cardiovascular:     Rate and Rhythm: Normal rate and regular rhythm.     Heart sounds: No murmur heard. Pulmonary:     Effort: Pulmonary effort is normal. No respiratory distress.     Breath sounds: Normal breath sounds.  Abdominal:     Palpations: Abdomen is soft.     Tenderness: There is generalized abdominal tenderness. There is no  guarding or rebound.  Musculoskeletal:        General: No swelling.     Cervical back: Neck supple.  Skin:    General: Skin is warm and dry.     Capillary Refill: Capillary refill takes less than 2 seconds.  Neurological:     Mental Status: She is alert.  Psychiatric:        Mood and Affect: Mood normal.     ED Results / Procedures / Treatments   Labs (all labs ordered are listed, but only abnormal results are displayed) Labs Reviewed  RESP PANEL BY RT-PCR (RSV, FLU A&B, COVID)  RVPGX2 - Abnormal; Notable for the following components:      Result Value   Influenza A by PCR POSITIVE (*)    All other  components within normal limits  COMPREHENSIVE METABOLIC PANEL - Abnormal; Notable for the following components:   Sodium 132 (*)    Potassium 3.0 (*)    Chloride 93 (*)    Glucose, Bld 174 (*)    BUN 27 (*)    All other components within normal limits  CBC - Abnormal; Notable for the following components:   WBC 13.8 (*)    All other components within normal limits  URINALYSIS, ROUTINE W REFLEX MICROSCOPIC - Abnormal; Notable for the following components:   APPearance HAZY (*)    Ketones, ur 5 (*)    Protein, ur 100 (*)    Bacteria, UA RARE (*)    All other components within normal limits  MAGNESIUM - Abnormal; Notable for the following components:   Magnesium 2.5 (*)    All other components within normal limits  LIPASE, BLOOD    EKG None  Radiology DG Abdomen 1 View Result Date: 08/06/2023 CLINICAL DATA:  Intractable nausea and vomiting EXAM: ABDOMEN - 1 VIEW COMPARISON:  CT abdomen 01/09/2023 FINDINGS: Prominent stool throughout the colon favors constipation. No dilated Geni Skorupski bowel. Mild axial articular space narrowing in both hips. Atherosclerosis noted. Right upper quadrant clips from prior cholecystectomy. There also right lower quadrant clips. Degenerative facet arthropathy at L5-S1. IMPRESSION: 1. Prominent stool throughout the colon favors constipation. 2. Atherosclerosis. 3. Degenerative facet arthropathy at L5-S1. Electronically Signed   By: Ryan Salvage M.D.   On: 08/06/2023 17:39    Procedures Procedures    Medications Ordered in ED Medications  potassium chloride  10 mEq in 100 mL IVPB (10 mEq Intravenous New Bag/Given 08/06/23 1858)  promethazine  (PHENERGAN ) 12.5 mg in sodium chloride  0.9 % 50 mL IVPB (0 mg Intravenous Stopped 08/06/23 1725)  sodium chloride  0.9 % bolus 1,000 mL (1,000 mLs Intravenous New Bag/Given 08/06/23 1659)  acetaminophen  (TYLENOL ) tablet 1,000 mg (1,000 mg Oral Given 08/06/23 1819)    ED Course/ Medical Decision Making/ A&P                                  Medical Decision Making Patient is a 63 year old female, history of multiple abdominal surgeries, here for abdominal pain, nausea, vomiting, and upper respiratory symptoms that have been going on for the last week.  She feels just very sick and tired, has congestion and bodyaches.  Has not been able to keep anything down.  States she is vomiting, and afterwards she has some abdominal pain.  Will obtain KUB given multiple abdominal surgeries, obtain COVID/flu swab, as well as electrolytes.  Give fluids, and Phenergan , as Zofran  was not helpful for patient.  Suspicious for URI.  Lungs clear  Amount and/or Complexity of Data Reviewed Labs: ordered.    Details: Mild hypokalemia 3.0, positive for influenza A Radiology: ordered.    Details: KUB shows constipation no other acute findings Discussion of management or test interpretation with external provider(s): Patient overall well-appearing, KUB shows constipation but no evidence of any kind of obstruction.  She is found to have influenza A, which aligns with her symptoms, with nausea, vomiting.  She was able to tolerate p.o. intake after being given some Phenergan .  Potassium 20 mEq was given IV, and she was provided with suppositories, as well as ODT Zofran , for home, for nausea/vomiting.  Instructed take MiraLAX for constipation when she is feeling a little bit better.  As well as return precautions.  Risk OTC drugs. Prescription drug management.    Final Clinical Impression(s) / ED Diagnoses Final diagnoses:  Influenza A  Generalized abdominal pain  Constipation, unspecified constipation type    Rx / DC Orders ED Discharge Orders          Ordered    ondansetron  (ZOFRAN -ODT) 4 MG disintegrating tablet  Every 8 hours PRN        08/06/23 1902    promethazine  (PHENERGAN ) 25 MG suppository  Every 6 hours PRN        08/06/23 1902    potassium chloride  SA (KLOR-CON  M) 20 MEQ tablet  2 times daily        08/06/23 1902               Philippa Lyle CROME, [place] 08/06/23 1944    Garrick Charleston, MD 08/06/23 2123

## 2023-08-06 NOTE — Discharge Instructions (Addendum)
 Your blood work today was reassuring except for some low potassium.  I have ordered to oral potassium to take at home.  Additionally you have the flu which is quite likely causing this intractable nausea, vomiting.  I think you to antiemetics to the pharmacy.  One of them goes up your rectum, that is the same medication I gave you here in the ER, this is so that you do not vomit it back up, or you can use the oral dissolvable Zofran .  Please return to the ER if you have intractable nausea, vomiting, worsening abdominal pain.  It is likely that your abdominal pain will last for the next few hours, or day, secondary to your frequent retching.

## 2023-08-06 NOTE — ED Triage Notes (Signed)
 Pt here from UC with c/o abd pain and n/v for about 1 week ,UC gave her some Zofran with minimal relief

## 2023-08-06 NOTE — ED Provider Notes (Signed)
 [facility]    CSN: [id number] Arrival date & time: 08/06/23  1414      History   Chief Complaint Chief Complaint  Patient presents with   Generalized Body Aches   Emesis    HPI Lori Watkins is a 63 y.o. female.   Patient complains of nausea and vomiting and abdominal pain that started about 3 days ago.  She reports she has not been able to eat anything in the last 3 days or keep any fluids down.  She reports her last bowel movement was about 1 week ago.  She complains of some intermittent subjective fevers and bodyaches.  Denies sick contacts.  History of cholecystectomy and C-section.    Past Medical History:  Diagnosis Date   Allergy    Arthritis    Asthma    Diabetes (HCC)    borderline   GERD (gastroesophageal reflux disease)    Seasonal allergies     There are no active problems to display for this patient.   Past Surgical History:  Procedure Laterality Date   APPENDECTOMY     CESAREAN SECTION     CHOLECYSTECTOMY     SPINE SURGERY     tumor removed   TOTAL ABDOMINAL HYSTERECTOMY     UPPER GASTROINTESTINAL ENDOSCOPY      OB History   No obstetric history on file.      Home Medications    Prior to Admission medications   Medication Sig Start Date End Date Taking? Authorizing Provider  acetaminophen  (TYLENOL ) 325 MG tablet Take 650 mg by mouth every 6 (six) hours as needed for mild pain.   Yes [provider]  famotidine  (PEPCID ) 20 MG tablet Take 1 tablet (20 mg total) by mouth at bedtime. Patient not taking: Reported on 08/06/2023 01/11/23   Kerman Vina HERO, NP  fluconazole  (DIFLUCAN ) 100 MG tablet Take 1 tablet (100 mg total) by mouth daily. Patient not taking: Reported on 08/06/2023 01/13/23   Nandigam, Kavitha V, MD  pantoprazole  (PROTONIX ) 40 MG tablet Take 1 tablet (40 mg total) by mouth 2 (two) times daily. Patient not taking: Reported on 08/06/2023 01/13/23   Nandigam, Kavitha V, MD  sucralfate  (CARAFATE ) 1 g tablet  Take 1 tablet (1 g total) by mouth 4 (four) times daily for 14 days. Patient not taking: Reported on 08/06/2023 01/13/23 01/27/23  Shila Gustav GAILS, MD    Family History Family History  Family history unknown: Yes    Social History Social History   Tobacco Use   Smoking status: Every Day    Current packs/day: 0.25    Types: Cigarettes   Smokeless tobacco: Never   Tobacco comments:    Last cigarette 830 01/13/23  Vaping Use   Vaping status: Never Used  Substance Use Topics   Alcohol use: Not Currently    Comment: 2020 last ETOH   Drug use: Never     Allergies   Morphine and codeine, Shellfish allergy, Ketorolac , Morphine and codeine, Sulfa antibiotics, Vicodin [hydrocodone -acetaminophen ], and Sulfa antibiotics   Review of Systems Review of Systems  Constitutional:  Negative for chills and fever.  HENT:  Negative for ear pain and sore throat.   Eyes:  Negative for pain and visual disturbance.  Respiratory:  Negative for cough and shortness of breath.   Cardiovascular:  Negative for chest pain and palpitations.  Gastrointestinal:  Positive for abdominal pain, nausea and vomiting.  Genitourinary:  Negative for dysuria and hematuria.  Musculoskeletal:  Negative for arthralgias and  back pain.  Skin:  Negative for color change and rash.  Neurological:  Negative for seizures and syncope.  All other systems reviewed and are negative.    Physical Exam Triage Vital Signs ED Triage Vitals  Encounter Vitals Group     BP 08/06/23 1435 110/76     Systolic BP Percentile --      Diastolic BP Percentile --      Pulse Rate 08/06/23 1435 84     Resp 08/06/23 1435 18     Temp 08/06/23 1435 98.1 F (36.7 C)     Temp Source 08/06/23 1435 Oral     SpO2 08/06/23 1435 95 %     Weight --      Height --      Head Circumference --      Peak Flow --      Pain Score 08/06/23 1432 10     Pain Loc --      Pain Education --      Exclude from Growth Chart --    No data  found.  Updated Vital Signs BP 110/76 (BP Location: Right Arm)   Pulse 84   Temp 98.1 F (36.7 C) (Oral)   Resp 18   SpO2 95%   Visual Acuity Right Eye Distance:   Left Eye Distance:   Bilateral Distance:    Right Eye Near:   Left Eye Near:    Bilateral Near:     Physical Exam Vitals and nursing note reviewed.  Constitutional:      General: She is not in acute distress.    Appearance: She is well-developed.  HENT:     Head: Normocephalic and atraumatic.  Eyes:     Conjunctiva/sclera: Conjunctivae normal.  Cardiovascular:     Rate and Rhythm: Normal rate and regular rhythm.     Heart sounds: No murmur heard. Pulmonary:     Effort: Pulmonary effort is normal. No respiratory distress.     Breath sounds: Normal breath sounds.  Abdominal:     General: Bowel sounds are decreased.     Palpations: Abdomen is soft.     Tenderness: There is no abdominal tenderness.     Comments: Diffuse abdominal pain tenderness,   Musculoskeletal:        General: No swelling.     Cervical back: Neck supple.  Skin:    General: Skin is warm and dry.     Capillary Refill: Capillary refill takes less than 2 seconds.  Neurological:     Mental Status: She is alert.  Psychiatric:        Mood and Affect: Mood normal.      UC Treatments / Results  Labs (all labs ordered are listed, but only abnormal results are displayed) Labs Reviewed - No data to display  EKG   Radiology No results found.  Procedures Procedures (including critical care time)  Medications Ordered in UC Medications  ondansetron  (ZOFRAN -ODT) disintegrating tablet 4 mg (4 mg Oral Given 08/06/23 1441)    Initial Impression / Assessment and Plan / UC Course  I have reviewed the triage vital signs and the nursing notes.  Pertinent labs & imaging results that were available during my care of the patient were reviewed by me and considered in my medical decision making (see chart for details).     Pt given Zofran   in clinic today without improvement, failed PO challenge.  Recommend further evaluation in the ED.  Final Clinical Impressions(s) / UC Diagnoses  Final diagnoses:  Abdominal pain, unspecified abdominal location  Nausea and vomiting, unspecified vomiting type     Discharge Instructions      Recommend follow up in the emergency department for further evaluation    ED Prescriptions   None    PDMP not reviewed this encounter.   Ward, Harlene PEDLAR, PA-C 08/06/23 1506

## 2023-08-06 NOTE — ED Triage Notes (Signed)
 Pt brought in by her husband. Reports sick x 1 week. Intermittent fever, headache, body aches. Reports constant vomiting and abd pain for the last 3 days. States she has been vomiting every half hour. Vomiting during triage. She has taken tylenol . Also reports loss of taste and smell

## 2023-12-29 ENCOUNTER — Ambulatory Visit
Admission: RE | Admit: 2023-12-29 | Discharge: 2023-12-29 | Disposition: A | Discharge status: Home / Self Care | Payer: Worker's Compensation | Source: Ambulatory Visit | Attending: Nurse Practitioner | Admitting: Nurse Practitioner

## 2023-12-29 ENCOUNTER — Other Ambulatory Visit: Payer: Self-pay | Admitting: Nurse Practitioner

## 2023-12-29 DIAGNOSIS — S8002XA Contusion of left knee, initial encounter: Secondary | ICD-10-CM
